# Patient Record
Sex: Female | Born: 1980 | Hispanic: Yes | Marital: Single | Smoking: Never smoker
Health system: Southern US, Community
[De-identification: ages and names within clinical notes are randomized; demographics above are authoritative.]

## PROBLEM LIST (undated history)

## (undated) DIAGNOSIS — E66812 Obesity, class 2: Secondary | ICD-10-CM

## (undated) DIAGNOSIS — I10 Essential (primary) hypertension: Secondary | ICD-10-CM

## (undated) DIAGNOSIS — E669 Obesity, unspecified: Secondary | ICD-10-CM

## (undated) DIAGNOSIS — E559 Vitamin D deficiency, unspecified: Secondary | ICD-10-CM

## (undated) DIAGNOSIS — L83 Acanthosis nigricans: Secondary | ICD-10-CM

## (undated) DIAGNOSIS — E8881 Metabolic syndrome: Secondary | ICD-10-CM

## (undated) DIAGNOSIS — T7840XA Allergy, unspecified, initial encounter: Secondary | ICD-10-CM

## (undated) HISTORY — DX: Essential (primary) hypertension: I10

## (undated) HISTORY — DX: Vitamin D deficiency, unspecified: E55.9

## (undated) HISTORY — DX: Allergy, unspecified, initial encounter: T78.40XA

## (undated) HISTORY — DX: Obesity, class 2: E66.812

## (undated) HISTORY — DX: Obesity, unspecified: E66.9

## (undated) HISTORY — PX: MOUTH SURGERY: SHX715

## (undated) HISTORY — DX: Metabolic syndrome: E88.810

## (undated) HISTORY — DX: Metabolic syndrome: E88.81

## (undated) HISTORY — DX: Acanthosis nigricans: L83

---

## 2013-08-26 LAB — HM PAP SMEAR: HM Pap smear: NORMAL

## 2013-11-23 LAB — LIPID PANEL
Cholesterol: 162 mg/dL (ref 0–200)
HDL: 46 mg/dL (ref 35–70)
LDL Cholesterol: 97 mg/dL
TRIGLYCERIDES: 93 mg/dL (ref 40–160)

## 2013-11-23 LAB — HEMOGLOBIN A1C: HEMOGLOBIN A1C: 5.4 % (ref 4.0–6.0)

## 2014-12-31 ENCOUNTER — Encounter: Payer: Self-pay | Admitting: Family Medicine

## 2014-12-31 DIAGNOSIS — L83 Acanthosis nigricans: Secondary | ICD-10-CM | POA: Insufficient documentation

## 2014-12-31 DIAGNOSIS — E8881 Metabolic syndrome: Secondary | ICD-10-CM | POA: Insufficient documentation

## 2014-12-31 DIAGNOSIS — E559 Vitamin D deficiency, unspecified: Secondary | ICD-10-CM | POA: Insufficient documentation

## 2014-12-31 DIAGNOSIS — J3089 Other allergic rhinitis: Secondary | ICD-10-CM | POA: Insufficient documentation

## 2014-12-31 DIAGNOSIS — E669 Obesity, unspecified: Secondary | ICD-10-CM | POA: Insufficient documentation

## 2014-12-31 DIAGNOSIS — I10 Essential (primary) hypertension: Secondary | ICD-10-CM | POA: Insufficient documentation

## 2015-01-04 ENCOUNTER — Ambulatory Visit (INDEPENDENT_AMBULATORY_CARE_PROVIDER_SITE_OTHER): Payer: BC Managed Care – PPO | Admitting: Family Medicine

## 2015-01-04 ENCOUNTER — Encounter (INDEPENDENT_AMBULATORY_CARE_PROVIDER_SITE_OTHER): Payer: Self-pay

## 2015-01-04 ENCOUNTER — Encounter: Payer: Self-pay | Admitting: Family Medicine

## 2015-01-04 VITALS — BP 132/84 | HR 98 | Temp 99.0°F | Resp 16 | Ht 61.0 in | Wt 201.3 lb

## 2015-01-04 DIAGNOSIS — J309 Allergic rhinitis, unspecified: Secondary | ICD-10-CM | POA: Diagnosis not present

## 2015-01-04 DIAGNOSIS — IMO0002 Reserved for concepts with insufficient information to code with codable children: Secondary | ICD-10-CM

## 2015-01-04 DIAGNOSIS — Z114 Encounter for screening for human immunodeficiency virus [HIV]: Secondary | ICD-10-CM | POA: Diagnosis not present

## 2015-01-04 DIAGNOSIS — E8881 Metabolic syndrome: Secondary | ICD-10-CM

## 2015-01-04 DIAGNOSIS — I10 Essential (primary) hypertension: Secondary | ICD-10-CM

## 2015-01-04 DIAGNOSIS — E668 Other obesity: Secondary | ICD-10-CM

## 2015-01-04 DIAGNOSIS — J3089 Other allergic rhinitis: Secondary | ICD-10-CM

## 2015-01-04 MED ORDER — LORATADINE 10 MG PO TABS: 10.0000 mg | ORAL_TABLET | ORAL | Status: DC

## 2015-01-04 MED ORDER — QSYMIA 15-92 MG PO CP24
1.0000 | ORAL_CAPSULE | Freq: Every day | ORAL | Status: DC
Start: 2015-01-04 — End: 2015-04-07

## 2015-01-04 MED ORDER — TRIAMTERENE-HCTZ 37.5-25 MG PO TABS: 1.0000 | ORAL_TABLET | Freq: Every day | ORAL | Status: DC

## 2015-01-04 NOTE — Progress Notes (Signed)
Name: Kaitlin Schroeder   MRN: [id number]    DOB: 1980-10-17   Date:01/04/2015       Progress Note  Subjective  Chief Complaint  Chief Complaint  Patient presents with  . Obesity  . Allergic Rhinitis   . Hypertension    HPI  Obesity: she has been taking Qsymia since 04/2013, initial weight was 218lbs, she exercises five days weekly.  Eats less than 300 calories for lunch and a light lunch - usually eats salads/soup and a home cooked meal for dinner. She has lost 5% of her original weight and denies side effects of Qsymia, such a dry mouth, constipation or paresthesias.  AR: noticing more nasal congestion, watery eyes, no sneezing or wheezing.  She is currently taking Loratadine and singulair, and will resume nasal steroid.  HTN: taking half dose of Triamterene-HCTZ, bp is at goal , may consider going up to one pill. No side effects, compliant with medication, denies chest pain no SOB.  Metabolic Syndrome: last hgbA1C was one year ago, no polydipsia, polyuria or polyphagia  Patient Active Problem List   Diagnosis Date Noted  . Acanthosis nigricans 12/31/2014  . Essential (primary) hypertension 12/31/2014  . Dysmetabolic syndrome 12/31/2014  . Adult BMI 30+ 12/31/2014  . Perennial allergic rhinitis 12/31/2014  . Vitamin D deficiency 12/31/2014    History  Substance Use Topics  . Smoking status: Never Smoker   . Smokeless tobacco: Not on file  . Alcohol Use: 0.0 oz/week    0 Standard drinks or equivalent per week     Comment: social     Current outpatient prescriptions:  .  Cholecalciferol (VITAMIN D) 2000 UNITS tablet, Take 1 tablet by mouth daily., Disp: , Rfl:  .  fluticasone (FLONASE) 50 MCG/ACT nasal spray, Place 2 sprays into the nose as needed., Disp: , Rfl:  .  loratadine (CLARITIN) 10 MG tablet, Take 1 tablet (10 mg total) by mouth 1 day or 1 dose., Disp: 90 tablet, Rfl: 1 .  montelukast (SINGULAIR) 10 MG tablet, Take 1 tablet by mouth daily., Disp: , Rfl:  .  Multiple  Vitamin tablet, Take 1 tablet by mouth daily., Disp: , Rfl:  .  QSYMIA 15-92 MG CP24, Take 1 tablet by mouth daily., Disp: 30 capsule, Rfl: 2 .  triamterene-hydrochlorothiazide (MAXZIDE-25) 37.5-25 MG per tablet, Take 1 tablet by mouth daily., Disp: 90 tablet, Rfl: 0  No Known Allergies  ROS  .Constitutional: Negative for fever or weight change.  Respiratory: Negative for cough and shortness of breath.   Cardiovascular: Negative for chest pain or palpitations.  Gastrointestinal: Negative for abdominal pain, no bowel changes.  Musculoskeletal: Negative for gait problem or joint swelling.  Skin: Negative for rash.  Neurological: Negative for dizziness or headache.  No other specific complaints in a complete review of systems (except as listed in HPI above). Objective  Filed Vitals:   01/04/15 0838  BP: 132/84  Pulse: 98  Temp: 99 F (37.2 C)  TempSrc: Oral  Resp: 16  Height: 5\' 1"  (1.549 m)  Weight: 201 lb 4.8 oz (91.309 kg)  SpO2: 98%    Body mass index is 38.05 kg/(m^2).    Physical Exam   Constitutional: Patient appears well-developed and well-nourished. No distress.  HENT: Head: Normocephalic and atraumatic. Ears: B TMs ok, no erythema or effusion; Nose: Nose normal. Mouth/Throat: Oropharynx is clear and moist. No oropharyngeal exudate.  Eyes: Conjunctivae and EOM are normal. Pupils are equal, round, and reactive to light. No scleral icterus.  Neck: Normal range of motion. Neck supple. No JVD present. No thyromegaly present.  Cardiovascular: Normal rate, regular rhythm and normal heart sounds.  No murmur heard. No BLE edema. Pulmonary/Chest: Effort normal and breath sounds normal. No respiratory distress. Abdominal: Soft. Bowel sounds are normal, no distension. There is no tenderness. no masses Musculoskeletal: Normal range of motion, no joint effusions. No gross deformities Neurological: he is alert and oriented to person, place, and time. No cranial nerve deficit.  Coordination, balance, strength, speech and gait are normal.  Skin: Skin is warm and dry. No rash noted. No erythema.  Psychiatric: Patient has a normal mood and affect. behavior is normal. Judgment and thought content normal.    Assessment & Plan   1. Essential (primary) hypertension Increase to one pill daily - triamterene-hydrochlorothiazide (MAXZIDE-25) 37.5-25 MG per tablet; Take 1 tablet by mouth daily.  Dispense: 90 tablet; Refill: 0 - Comprehensive Metabolic Panel (CMET) - Lipid Profile  2. Perennial allergic rhinitis Resume flonase - loratadine (CLARITIN) 10 MG tablet; Take 1 tablet (10 mg total) by mouth 1 day or 1 dose.  Dispense: 90 tablet; Refill: 1  3. Adult BMI 30+ Advised to eat a heavier lunch and eat a light dinner, continue physical activity - QSYMIA 15-92 MG CP24; Take 1 tablet by mouth daily.  Dispense: 30 capsule; Refill: 2  4. Dysmetabolic syndrome  doing well, recheck labs - HgB A1c  5. Encounter for screening for HIV  - HIV antibody (with reflex)

## 2015-01-05 LAB — HEPATIC FUNCTION PANEL
ALK PHOS: 55 U/L (ref 25–125)
ALT: 28 U/L (ref 7–35)
AST: 22 U/L (ref 13–35)
BILIRUBIN, TOTAL: 0.4 mg/dL

## 2015-01-05 LAB — LIPID PANEL
Cholesterol: 135 mg/dL (ref 0–200)
HDL: 38 mg/dL (ref 35–70)
LDL Cholesterol: 87 mg/dL
TRIGLYCERIDES: 49 mg/dL (ref 40–160)

## 2015-01-05 LAB — BASIC METABOLIC PANEL
BUN: 11 mg/dL (ref 4–21)
CREATININE: 1 mg/dL (ref 0.5–1.1)
GLUCOSE: 83 mg/dL
Glucose: 103 mg/dL
Potassium: 3.4 mmol/L (ref 3.4–5.3)
SODIUM: 142 mmol/L (ref 137–147)

## 2015-01-05 LAB — HEMOGLOBIN A1C: HEMOGLOBIN A1C: 5.2 % (ref 4.0–6.0)

## 2015-01-25 ENCOUNTER — Encounter: Payer: Self-pay | Admitting: Family Medicine

## 2015-01-31 ENCOUNTER — Encounter: Payer: Self-pay | Admitting: Family Medicine

## 2015-03-14 ENCOUNTER — Encounter: Payer: Self-pay | Admitting: Family Medicine

## 2015-04-07 ENCOUNTER — Encounter: Payer: Self-pay | Admitting: Family Medicine

## 2015-04-07 ENCOUNTER — Ambulatory Visit (INDEPENDENT_AMBULATORY_CARE_PROVIDER_SITE_OTHER): Payer: BC Managed Care – PPO | Admitting: Family Medicine

## 2015-04-07 VITALS — BP 108/62 | HR 95 | Temp 98.3°F | Resp 16 | Ht 61.0 in | Wt 201.6 lb

## 2015-04-07 DIAGNOSIS — I10 Essential (primary) hypertension: Secondary | ICD-10-CM

## 2015-04-07 DIAGNOSIS — Z23 Encounter for immunization: Secondary | ICD-10-CM

## 2015-04-07 DIAGNOSIS — J3089 Other allergic rhinitis: Secondary | ICD-10-CM

## 2015-04-07 DIAGNOSIS — J309 Allergic rhinitis, unspecified: Secondary | ICD-10-CM

## 2015-04-07 DIAGNOSIS — IMO0002 Reserved for concepts with insufficient information to code with codable children: Secondary | ICD-10-CM

## 2015-04-07 DIAGNOSIS — E668 Other obesity: Secondary | ICD-10-CM | POA: Diagnosis not present

## 2015-04-07 MED ORDER — OLOPATADINE HCL 0.1 % OP SOLN: 1.0000 [drp] | Freq: Two times a day (BID) | OPHTHALMIC | Status: DC

## 2015-04-07 MED ORDER — QSYMIA 15-92 MG PO CP24: 1.0000 | ORAL_CAPSULE | Freq: Every day | ORAL | Status: DC

## 2015-04-07 NOTE — Progress Notes (Signed)
Name: Kaitlin Schroeder   MRN: [id number]    DOB: 12/04/1980   Date:04/07/2015       Progress Note  Subjective  Chief Complaint  Chief Complaint  Patient presents with  . Medication Refill    3 month F/U  . Hypertension  . Allergic Rhinitis     Congestion, and watery eyes, patient is taking medication daily and just added Flonase  . Obesity    Working out 5x weekly with 30 minutes sessions, and doing well on medication    HPI  Obesity: she has been taking Qsymia since 04/2013, initial weight was 218lbs, she exercises five days weekly. Eats less than 300 calories for breakfast  and has a  light lunch - usually eats salads/soup and a home cooked meal for dinner. She has lost 5% of her original weight and denies side effects of Qsymia, such a dry mouth, constipation or paresthesias. Weight has not changed since last visit. Discussed other options, she does not want an injectable at this time. Also discussed Belviq and Contrave  AR: noticing more  watery eyes, no sneezing or wheezing, nasal congestion has improved with nasal steroids prn . She is currently taking Loratadine and singulair.   HTN: taking half dose of Triamterene-HCTZ, bp is at goal. No side effects, compliant with medication, denies chest pain no SOB.  Metabolic Syndrome: last hgbA1C was one year ago, no polydipsia, polyuria or polyphagia  Patient Active Problem List   Diagnosis Date Noted  . Acanthosis nigricans 12/31/2014  . Essential (primary) hypertension 12/31/2014  . Dysmetabolic syndrome 12/31/2014  . Adult BMI 30+ 12/31/2014  . Perennial allergic rhinitis 12/31/2014  . Vitamin D deficiency 12/31/2014    Past Surgical History  Procedure Laterality Date  . Mouth surgery      Family History  Problem Relation Age of Onset  . Hypertension Mother   . Hypertension Father     Social History   Social History  . Marital Status: Single    Spouse Name: N/A  . Number of Children: N/A  . Years of Education: N/A    Occupational History  . Not on file.   Social History Main Topics  . Smoking status: Never Smoker   . Smokeless tobacco: Never Used  . Alcohol Use: 0.0 oz/week    0 Standard drinks or equivalent per week     Comment: social  . Drug Use: No  . Sexual Activity: Not Currently   Other Topics Concern  . Not on file   Social History Narrative     Current outpatient prescriptions:  .  Cholecalciferol (VITAMIN D) 2000 UNITS tablet, Take 1 tablet by mouth daily., Disp: , Rfl:  .  fluticasone (FLONASE) 50 MCG/ACT nasal spray, Place 2 sprays into the nose as needed., Disp: , Rfl:  .  loratadine (CLARITIN) 10 MG tablet, Take 1 tablet (10 mg total) by mouth 1 day or 1 dose., Disp: 90 tablet, Rfl: 1 .  montelukast (SINGULAIR) 10 MG tablet, Take 1 tablet by mouth daily., Disp: , Rfl:  .  Multiple Vitamin tablet, Take 1 tablet by mouth daily., Disp: , Rfl:  .  QSYMIA 15-92 MG CP24, Take 1 tablet by mouth daily., Disp: 30 capsule, Rfl: 2 .  triamterene-hydrochlorothiazide (MAXZIDE-25) 37.5-25 MG per tablet, Take 1 tablet by mouth daily., Disp: 90 tablet, Rfl: 0  No Known Allergies   ROS  Constitutional: Negative for fever or weight change.  Respiratory: Negative for cough and shortness of breath.   Cardiovascular:  Negative for chest pain or palpitations.  Gastrointestinal: Negative for abdominal pain, no bowel changes.  Musculoskeletal: Negative for gait problem or joint swelling.  Skin: Negative for rash.  Neurological: Negative for dizziness or headache.  No other specific complaints in a complete review of systems (except as listed in HPI above).  Objective  Filed Vitals:   04/07/15 0818  BP: 108/62  Pulse: 95  Temp: 98.3 F (36.8 C)  TempSrc: Oral  Resp: 16  Height:  (1.549 m)  Weight: 201 lb 9.6 oz (91.445 kg)  SpO2: 98%    Body mass index is 38.11 kg/(m^2).  Physical Exam  Constitutional: Patient appears well-developed and well-nourished. Obese No distress.   HEENT: head atraumatic, normocephalic, pupils equal and reactive to light, neck supple, throat within normal limits Cardiovascular: Normal rate, regular rhythm and normal heart sounds.  No murmur heard. Trace  BLE edema. Pulmonary/Chest: Effort normal and breath sounds normal. No respiratory distress. Abdominal: Soft.  There is no tenderness. Psychiatric: Patient has a normal mood and affect. behavior is normal. Judgment and thought content normal.  PHQ2/9: Depression screen [facility] 2/9 04/07/2015 01/04/2015  Decreased Interest 0 0  Down, Depressed, Hopeless 0 0  PHQ - 2 Score 0 0     Fall Risk: Fall Risk  04/07/2015 01/04/2015  Falls in the past year? No No    Functional Status Survey: Is the patient deaf or have difficulty hearing?: No Does the patient have difficulty seeing, even when wearing glasses/contacts?: No Does the patient have difficulty concentrating, remembering, or making decisions?: No Does the patient have difficulty walking or climbing stairs?: No Does the patient have difficulty dressing or bathing?: No Does the patient have difficulty doing errands alone such as visiting a doctor's office or shopping?: No    Assessment & Plan    1. Essential (primary) hypertension  Doing great, continue current dose of Triamterene/HCTZ DASH diet and physical activity  2. Perennial allergic rhinitis  We will add medication for eye symptoms, since symptoms are present on her right eye, also advised follow up with Opthalmologist  to make sure it is not the tear duct that is plugged - olopatadine (PATANOL) 0.1 % ophthalmic solution; Place 1 drop into both eyes 2 (two) times daily.  Dispense: 5 mL; Refill: 2  3. Needs flu shot  - Flu Vaccine QUAD 36+ mos PF IM (Fluarix & Fluzone Quad PF) - refused she will get it at work   4. Adult BMI 30+  Continue medication , diet and exercise - QSYMIA 15-92 MG CP24; Take 1 tablet by mouth daily.  Dispense: 30 capsule; Refill: 2

## 2015-05-05 ENCOUNTER — Encounter: Payer: Self-pay | Admitting: Family Medicine

## 2015-05-26 ENCOUNTER — Other Ambulatory Visit: Payer: Self-pay | Admitting: Family Medicine

## 2015-05-26 NOTE — Telephone Encounter (Signed)
Patient requesting refill. 

## 2015-06-29 ENCOUNTER — Ambulatory Visit (INDEPENDENT_AMBULATORY_CARE_PROVIDER_SITE_OTHER): Payer: BC Managed Care – PPO | Admitting: Family Medicine

## 2015-06-29 ENCOUNTER — Encounter: Payer: Self-pay | Admitting: Family Medicine

## 2015-06-29 VITALS — BP 122/82 | HR 111 | Temp 98.9°F | Resp 16 | Ht 61.0 in | Wt 199.9 lb

## 2015-06-29 DIAGNOSIS — I10 Essential (primary) hypertension: Secondary | ICD-10-CM | POA: Diagnosis not present

## 2015-06-29 DIAGNOSIS — J309 Allergic rhinitis, unspecified: Secondary | ICD-10-CM

## 2015-06-29 DIAGNOSIS — E668 Other obesity: Secondary | ICD-10-CM | POA: Diagnosis not present

## 2015-06-29 DIAGNOSIS — IMO0002 Reserved for concepts with insufficient information to code with codable children: Secondary | ICD-10-CM

## 2015-06-29 DIAGNOSIS — E8881 Metabolic syndrome: Secondary | ICD-10-CM

## 2015-06-29 DIAGNOSIS — J3089 Other allergic rhinitis: Secondary | ICD-10-CM

## 2015-06-29 MED ORDER — TRIAMTERENE-HCTZ 37.5-25 MG PO TABS: 1.0000 | ORAL_TABLET | Freq: Every day | ORAL | Status: DC

## 2015-06-29 MED ORDER — MONTELUKAST SODIUM 10 MG PO TABS: 10.0000 mg | ORAL_TABLET | Freq: Every day | ORAL | Status: DC

## 2015-06-29 MED ORDER — QSYMIA 15-92 MG PO CP24: 1.0000 | ORAL_CAPSULE | Freq: Every day | ORAL | Status: DC

## 2015-06-29 MED ORDER — NALTREXONE-BUPROPION HCL ER 8-90 MG PO TB12: 2.0000 | ORAL_TABLET | Freq: Two times a day (BID) | ORAL | Status: DC

## 2015-06-29 NOTE — Progress Notes (Signed)
Name: Kaitlin Schroeder   MRN: [id number]    DOB: 1980/07/24   Date:06/29/2015       Progress Note  Subjective  Chief Complaint  Chief Complaint  Patient presents with  . Medication Refill    3 month F/U  . Hypertension  . Obesity    Lost 3 pounds since last visit, exercise 5x a week-30 minutes daily, 3 meals a day  . Allergic Rhinitis     Well controlled    HPI  HTN:  Patient is taking half pill of Triamterene/HCTZ and is doing well, no chest pain , no SOB, no palpation. BP not being checked at home, but is at goal today  Obesity: she has been taking Qsymia since 04/2013, initial weight was 218lbs, she exercises five days weekly. Eats less than 300 calories for breakfast and has a light lunch - usually eats salads/soup and a home cooked meal for dinner. She has lost 5% of her original weight and denies side effects of Qsymia, such a dry mouth, constipation or paresthesias. Weight has decreased since last visit, down by 3 lbs.  Discussed other options, she does not want an injectable at this time. Also discussed Belviq and Contrave. She states insurance will change in coverage in January and we will need to switch from Qsymia to Contrave  AR: she is taking medication and has been doing well, symptoms are controlled, no rhinorrhea, nasal congestion or sneezing at this time.   Metabolic Syndrome: on life style modification, denies polyphagia, polyuria or polydipsia.    Patient Active Problem List   Diagnosis Date Noted  . Acanthosis nigricans 12/31/2014  . Essential (primary) hypertension 12/31/2014  . Dysmetabolic syndrome 12/31/2014  . Adult BMI 30+ 12/31/2014  . Perennial allergic rhinitis 12/31/2014  . Vitamin D deficiency 12/31/2014    Past Surgical History  Procedure Laterality Date  . Mouth surgery      Family History  Problem Relation Age of Onset  . Hypertension Mother   . Hypertension Father     Social History   Social History  . Marital Status: Single     Spouse Name: N/A  . Number of Children: N/A  . Years of Education: N/A   Occupational History  . Not on file.   Social History Main Topics  . Smoking status: Never Smoker   . Smokeless tobacco: Never Used  . Alcohol Use: 0.0 oz/week    0 Standard drinks or equivalent per week     Comment: social  . Drug Use: No  . Sexual Activity: Not Currently   Other Topics Concern  . Not on file   Social History Narrative     Current outpatient prescriptions:  .  Cholecalciferol (VITAMIN D) 2000 UNITS tablet, Take 1 tablet by mouth daily., Disp: , Rfl:  .  fluticasone (FLONASE) 50 MCG/ACT nasal spray, Place 2 sprays into the nose as needed., Disp: , Rfl:  .  loratadine (CLARITIN) 10 MG tablet, Take 1 tablet (10 mg total) by mouth 1 day or 1 dose., Disp: 90 tablet, Rfl: 1 .  montelukast (SINGULAIR) 10 MG tablet, Take 1 tablet (10 mg total) by mouth daily., Disp: 90 tablet, Rfl: 0 .  Multiple Vitamin tablet, Take 1 tablet by mouth daily., Disp: , Rfl:  .  olopatadine (PATANOL) 0.1 % ophthalmic solution, Place 1 drop into both eyes 2 (two) times daily., Disp: 5 mL, Rfl: 2 .  QSYMIA 15-92 MG CP24, Take 1 tablet by mouth daily., Disp: 30 capsule, Rfl:  2 .  triamterene-hydrochlorothiazide (MAXZIDE-25) 37.5-25 MG tablet, Take 1 tablet by mouth daily., Disp: 90 tablet, Rfl: 0  No Known Allergies   ROS  Constitutional: Negative for fever or significant  weight change.  Respiratory: Negative for cough and shortness of breath.   Cardiovascular: Negative for chest pain or palpitations.  Gastrointestinal: Negative for abdominal pain, no bowel changes.  Musculoskeletal: Negative for gait problem or joint swelling.  Skin: Negative for rash.  Neurological: Negative for dizziness or headache.  No other specific complaints in a complete review of systems (except as listed in HPI above).  Objective  Filed Vitals:   06/29/15 0820  BP: 122/82  Pulse: 111  Temp: 98.9 F (37.2 C)  TempSrc: Oral   Resp: 16  Height: 5\' 1"  (1.549 m)  Weight: 199 lb 14.4 oz (90.674 kg)  SpO2: 98%    Body mass index is 37.79 kg/(m^2).  Physical Exam  Constitutional: Patient appears well-developed and well-nourished. Obese  No distress.  HEENT: head atraumatic, normocephalic, pupils equal and reactive to light, ears  TM, neck supple, throat within normal limits Cardiovascular: Normal rate, regular rhythm and normal heart sounds.  No murmur heard. No BLE edema. Pulmonary/Chest: Effort normal and breath sounds normal. No respiratory distress. Abdominal: Soft.  There is no tenderness. Psychiatric: Patient has a normal mood and affect. behavior is normal. Judgment and thought content normal.   [facility]9: [facility] 06/29/2015 04/07/2015 01/04/2015  Decreased Interest 0 0 0  Down, Depressed, Hopeless 0 0 0  PHQ - 2 Score 0 0 0    Fall Risk: Fall Risk  06/29/2015 04/07/2015 01/04/2015  Falls in the past year? No No No    Functional Status Survey: Is the patient deaf or have difficulty hearing?: No Does the patient have difficulty seeing, even when wearing glasses/contacts?: No Does the patient have difficulty concentrating, remembering, or making decisions?: No Does the patient have difficulty walking or climbing stairs?: No Does the patient have difficulty dressing or bathing?: No Does the patient have difficulty doing errands alone such as visiting a doctor's office or shopping?: No    Assessment & Plan  1. Essential (primary) hypertension  Doing well, no side effects, continue medication  - triamterene-hydrochlorothiazide (MAXZIDE-25) 37.5-25 MG tablet; Take 1 tablet by mouth daily.  Dispense: 90 tablet; Refill: 0  2. Adult BMI 30+  - QSYMIA 15-92 MG CP24; Take 1 tablet by mouth daily.  Dispense: 30 capsule She will submit Contrave on January first for PA - since insurance will stop covering Qsymia, and we may need to do a PA  3. Dysmetabolic syndrome  Continue life  style modification   4. Perennial allergic rhinitis  Well controlled at this time - montelukast (SINGULAIR) 10 MG tablet; Take 1 tablet (10 mg total) by mouth daily.  Dispense: 90 tablet; Refill: 0

## 2015-06-29 NOTE — Addendum Note (Signed)
Addended by: Alba CorySOWLES, Juluis Fitzsimmons F on: 06/29/2015 08:59 AM   Modules accepted: Orders, Medications, SmartSet

## 2015-08-12 ENCOUNTER — Encounter: Payer: Self-pay | Admitting: Family Medicine

## 2015-09-14 ENCOUNTER — Ambulatory Visit (INDEPENDENT_AMBULATORY_CARE_PROVIDER_SITE_OTHER): Payer: BC Managed Care – PPO | Admitting: Family Medicine

## 2015-09-14 ENCOUNTER — Encounter: Payer: Self-pay | Admitting: Family Medicine

## 2015-09-14 VITALS — BP 134/86 | HR 89 | Temp 98.8°F | Resp 16 | Ht 61.0 in | Wt 203.7 lb

## 2015-09-14 DIAGNOSIS — Z124 Encounter for screening for malignant neoplasm of cervix: Secondary | ICD-10-CM

## 2015-09-14 DIAGNOSIS — IMO0002 Reserved for concepts with insufficient information to code with codable children: Secondary | ICD-10-CM

## 2015-09-14 DIAGNOSIS — J309 Allergic rhinitis, unspecified: Secondary | ICD-10-CM | POA: Diagnosis not present

## 2015-09-14 DIAGNOSIS — E668 Other obesity: Secondary | ICD-10-CM

## 2015-09-14 DIAGNOSIS — I10 Essential (primary) hypertension: Secondary | ICD-10-CM | POA: Diagnosis not present

## 2015-09-14 DIAGNOSIS — Z0000 Encounter for general adult medical examination without abnormal findings: Secondary | ICD-10-CM

## 2015-09-14 DIAGNOSIS — J3089 Other allergic rhinitis: Secondary | ICD-10-CM

## 2015-09-14 DIAGNOSIS — Z01419 Encounter for gynecological examination (general) (routine) without abnormal findings: Secondary | ICD-10-CM

## 2015-09-14 MED ORDER — MONTELUKAST SODIUM 10 MG PO TABS: 10.0000 mg | ORAL_TABLET | Freq: Every day | ORAL | Status: DC

## 2015-09-14 MED ORDER — TRIAMTERENE-HCTZ 37.5-25 MG PO TABS: 1.0000 | ORAL_TABLET | Freq: Every day | ORAL | Status: DC

## 2015-09-14 MED ORDER — VIT C-QUERCET-BIOFLV-BROMELAIN 450-250-125-50 MG PO CAPS: 1.0000 | ORAL_CAPSULE | Freq: Every day | ORAL | Status: DC

## 2015-09-14 NOTE — Progress Notes (Signed)
Name: Kaitlin Schroeder   MRN: [id number]    DOB: 04/25/1981   Date:09/14/2015       Progress Note  Subjective  Chief Complaint  Chief Complaint  Patient presents with  . Annual Exam    HPI  Well Woman: patient states allergies is flaring a little bit. No vaginal symptoms or urinary complaints. Cycles are regular and moderate flow except for the first day that is heavy and about 28 days apart. No cramping, not on ocp, practices abstinence.   HTN: patient is taking medication and denies side effects, no chest pain, no palpitation. BP at home is usually around 130's/80's.   Perennial Allergic Rhinitis: taking Loratadine prn, currently on supplement called Quercetin with Bromelain and it seems to be helping with symptoms. She has some nasal congestion and runny eye, no itching.   Adult Obesity: insurance stopped paying for Qsymia, so she just got switched to [facility], she had a gap in medication, gained 3.7 lbs. Still not taking full dose of Contrave. Gave reassurance, continue diet , medication and resume exercise  Patient Active Problem List   Diagnosis Date Noted  . Acanthosis nigricans 12/31/2014  . Essential (primary) hypertension 12/31/2014  . Dysmetabolic syndrome 12/31/2014  . Adult BMI 30+ 12/31/2014  . Perennial allergic rhinitis 12/31/2014  . Vitamin D deficiency 12/31/2014    Past Surgical History  Procedure Laterality Date  . Mouth surgery      Family History  Problem Relation Age of Onset  . Hypertension Mother   . Hypertension Father     Social History   Social History  . Marital Status: Single    Spouse Name: N/A  . Number of Children: N/A  . Years of Education: N/A   Occupational History  . Not on file.   Social History Main Topics  . Smoking status: Never Smoker   . Smokeless tobacco: Never Used  . Alcohol Use: 0.0 oz/week    0 Standard drinks or equivalent per week     Comment: social  . Drug Use: No  . Sexual Activity: Not Currently   Other  Topics Concern  . Not on file   Social History Narrative     Current outpatient prescriptions:  .  Cholecalciferol (VITAMIN D) 2000 UNITS tablet, Take 1 tablet by mouth daily., Disp: , Rfl:  .  fluticasone (FLONASE) 50 MCG/ACT nasal spray, Place 2 sprays into the nose as needed., Disp: , Rfl:  .  loratadine (CLARITIN) 10 MG tablet, Take 1 tablet (10 mg total) by mouth 1 day or 1 dose., Disp: 90 tablet, Rfl: 1 .  montelukast (SINGULAIR) 10 MG tablet, Take 1 tablet (10 mg total) by mouth daily., Disp: 90 tablet, Rfl: 0 .  Multiple Vitamin tablet, Take 1 tablet by mouth daily., Disp: , Rfl:  .  Naltrexone-Bupropion HCl ER (CONTRAVE) 8-90 MG TB12, Take 2 tablets by mouth 2 (two) times daily., Disp: 120 tablet, Rfl: 2 .  olopatadine (PATANOL) 0.1 % ophthalmic solution, Place 1 drop into both eyes 2 (two) times daily., Disp: 5 mL, Rfl: 2 .  triamterene-hydrochlorothiazide (MAXZIDE-25) 37.5-25 MG tablet, Take 1 tablet by mouth daily., Disp: 90 tablet, Rfl: 0  No Known Allergies   ROS  Constitutional: Negative for fever or significant  weight change.  Respiratory: Negative for cough and shortness of breath.   Cardiovascular: Negative for chest pain or palpitations.  Gastrointestinal: Negative for abdominal pain, no bowel changes.  Musculoskeletal: Negative for gait problem or joint swelling.  Skin: Negative  for rash.  Neurological: Negative for dizziness or headache.  No other specific complaints in a complete review of systems (except as listed in HPI above).  Objective  Filed Vitals:   09/14/15 0816  BP: 134/86  Pulse: 89  Temp: 98.8 F (37.1 C)  TempSrc: Oral  Resp: 16  Height:  (1.549 m)  Weight: 203 lb 11.2 oz (92.398 kg)  SpO2: 96%    Body mass index is 38.51 kg/(m^2).  Physical Exam  Constitutional: Patient appears well-developed and well-nourished. No distress.  HENT: Head: Normocephalic and atraumatic. Ears: B TMs ok, no erythema or effusion; Nose: Nose  normal. Mouth/Throat: Oropharynx is clear and moist. No oropharyngeal exudate.  Eyes: Conjunctivae and EOM are normal. Pupils are equal, round, and reactive to light. No scleral icterus.  Neck: Normal range of motion. Neck supple. No JVD present. No thyromegaly present.  Cardiovascular: Normal rate, regular rhythm and normal heart sounds.  No murmur heard. No BLE edema. Pulmonary/Chest: Effort normal and breath sounds normal. No respiratory distress. Abdominal: Soft. Bowel sounds are normal, no distension. There is no tenderness. no masses Breast: no lumps or masses, no nipple discharge or rashes FEMALE GENITALIA:  External genitalia normal External urethra normal Pelvic not done RECTAL: not done Musculoskeletal: Normal range of motion, no joint effusions. No gross deformities Neurological: he is alert and oriented to person, place, and time. No cranial nerve deficit. Coordination, balance, strength, speech and gait are normal.  Skin: Skin is warm and dry. No rash noted. No erythema.  Psychiatric: Patient has a normal mood and affect. behavior is normal. Judgment and thought content normal.  PHQ2/9: Depression screen [facility] 2/9 09/14/2015 06/29/2015 04/07/2015 01/04/2015  Decreased Interest 0 0 0 0  Down, Depressed, Hopeless 0 0 0 0  PHQ - 2 Score 0 0 0 0    Fall Risk: Fall Risk  09/14/2015 06/29/2015 04/07/2015 01/04/2015  Falls in the past year? No No No No    Functional Status Survey: Is the patient deaf or have difficulty hearing?: No Does the patient have difficulty seeing, even when wearing glasses/contacts?: No Does the patient have difficulty concentrating, remembering, or making decisions?: No Does the patient have difficulty walking or climbing stairs?: No Does the patient have difficulty dressing or bathing?: No Does the patient have difficulty doing errands alone such as visiting a doctor's office or shopping?: No    Assessment & Plan  1. Well woman exam  Discussed  importance of 150 minutes of physical activity weekly, eat two servings of fish weekly, eat one serving of tree nuts ( cashews, pistachios, pecans, almonds.Marland Kitchen) every other day, eat 6 servings of fruit/vegetables daily and drink plenty of water and avoid sweet beverages.   2. Cervical cancer screening   not due until next year  3. Essential (primary) hypertension  - triamterene-hydrochlorothiazide (MAXZIDE-25) 37.5-25 MG tablet; Take 1 tablet by mouth daily.  Dispense: 90 tablet; Refill: 0  4. Perennial allergic rhinitis  - montelukast (SINGULAIR) 10 MG tablet; Take 1 tablet (10 mg total) by mouth daily.  Dispense: 90 tablet; Refill: 0 - Vit C-Quercet-Bioflv-Bromelain 450-250-125-50 MG CAPS; Take 1 capsule by mouth daily.  Dispense: 30 capsule; Refill: 0  5. Adult BMI 30+  Discussed with the patient the risk posed by an increased BMI. Discussed importance of portion control, calorie counting and at least 150 minutes of physical activity weekly. Avoid sweet beverages and drink more water. Eat at least 6 servings of fruit and vegetables daily

## 2015-09-27 ENCOUNTER — Ambulatory Visit: Payer: BC Managed Care – PPO | Admitting: Family Medicine

## 2015-12-21 ENCOUNTER — Encounter: Payer: Self-pay | Admitting: Family Medicine

## 2015-12-21 ENCOUNTER — Ambulatory Visit (INDEPENDENT_AMBULATORY_CARE_PROVIDER_SITE_OTHER): Payer: BC Managed Care – PPO | Admitting: Family Medicine

## 2015-12-21 VITALS — BP 124/86 | HR 96 | Temp 98.6°F | Resp 16 | Wt 205.6 lb

## 2015-12-21 DIAGNOSIS — J309 Allergic rhinitis, unspecified: Secondary | ICD-10-CM

## 2015-12-21 DIAGNOSIS — I10 Essential (primary) hypertension: Secondary | ICD-10-CM

## 2015-12-21 DIAGNOSIS — E8881 Metabolic syndrome: Secondary | ICD-10-CM

## 2015-12-21 DIAGNOSIS — E668 Other obesity: Secondary | ICD-10-CM

## 2015-12-21 DIAGNOSIS — Z13220 Encounter for screening for lipoid disorders: Secondary | ICD-10-CM

## 2015-12-21 DIAGNOSIS — IMO0002 Reserved for concepts with insufficient information to code with codable children: Secondary | ICD-10-CM

## 2015-12-21 DIAGNOSIS — E559 Vitamin D deficiency, unspecified: Secondary | ICD-10-CM

## 2015-12-21 DIAGNOSIS — J3089 Other allergic rhinitis: Secondary | ICD-10-CM

## 2015-12-21 MED ORDER — TRIAMTERENE-HCTZ 37.5-25 MG PO TABS: 1.0000 | ORAL_TABLET | Freq: Every day | ORAL | Status: DC

## 2015-12-21 MED ORDER — NALTREXONE-BUPROPION HCL ER 8-90 MG PO TB12: 2.0000 | ORAL_TABLET | Freq: Two times a day (BID) | ORAL | Status: DC

## 2015-12-21 MED ORDER — MONTELUKAST SODIUM 10 MG PO TABS: 10.0000 mg | ORAL_TABLET | Freq: Every day | ORAL | Status: DC

## 2015-12-21 NOTE — Progress Notes (Signed)
Name: Kaitlin Schroeder   MRN: [id number]    DOB: 04-Dec-1980   Date:12/21/2015       Progress Note  Subjective  Chief Complaint  Chief Complaint  Patient presents with  . Follow-up    patient is here for her 68-month f/u    HPI   HTN: patient is taking medication and denies side effects, no chest pain,  Palpitation or SOB. She has not been checking bp frequently but last time it was 125/84.    Perennial Allergic Rhinitis: taking Loratadine and singulair and nasal spray prn. She has some nasal congestion and runny eye almost every morning, no itching.   Adult Obesity: insurance stopped paying for Qsymia, her original weight was 218 lbs on 04/2013.  She was down to 199 lbs in December, but medication had to be switched to [facility] around Feb 2017 after a gap on medication and she is up to 205.6%. She has been taking medication daily, walking on average 6000 steps per day, no longer walking at lunch, logging meals occasionally. Goal is to go down to below 200 again. She will log her intake on weekends and try to walk 78295 steps before next visit. No side effects of medication, it seems to curb her appetite  Metabolic Syndrome: hgbA1C was 5.2 one year ago. She denies polyphagia, polydipsia or polyuria.  Patient Active Problem List   Diagnosis Date Noted  . Acanthosis nigricans 12/31/2014  . Essential (primary) hypertension 12/31/2014  . Dysmetabolic syndrome 12/31/2014  . Adult BMI 30+ 12/31/2014  . Perennial allergic rhinitis 12/31/2014  . Vitamin D deficiency 12/31/2014    Past Surgical History  Procedure Laterality Date  . Mouth surgery      Family History  Problem Relation Age of Onset  . Hypertension Mother   . Hypertension Father     Social History   Social History  . Marital Status: Single    Spouse Name: N/A  . Number of Children: N/A  . Years of Education: N/A   Occupational History  . Not on file.   Social History Main Topics  . Smoking status: Never Smoker   .  Smokeless tobacco: Never Used  . Alcohol Use: 0.0 oz/week    0 Standard drinks or equivalent per week     Comment: social  . Drug Use: No  . Sexual Activity: Not Currently   Other Topics Concern  . Not on file   Social History Narrative     Current outpatient prescriptions:  .  Cholecalciferol (VITAMIN D) 2000 UNITS tablet, Take 1 tablet by mouth daily., Disp: , Rfl:  .  fluticasone (FLONASE) 50 MCG/ACT nasal spray, Place 2 sprays into the nose as needed., Disp: , Rfl:  .  loratadine (CLARITIN) 10 MG tablet, Take 1 tablet (10 mg total) by mouth 1 day or 1 dose., Disp: 90 tablet, Rfl: 1 .  montelukast (SINGULAIR) 10 MG tablet, Take 1 tablet (10 mg total) by mouth daily., Disp: 90 tablet, Rfl: 1 .  Multiple Vitamin tablet, Take 1 tablet by mouth daily., Disp: , Rfl:  .  Naltrexone-Bupropion HCl ER (CONTRAVE) 8-90 MG TB12, Take 2 tablets by mouth 2 (two) times daily., Disp: 120 tablet, Rfl: 2 .  olopatadine (PATANOL) 0.1 % ophthalmic solution, Place 1 drop into both eyes 2 (two) times daily., Disp: 5 mL, Rfl: 2 .  triamterene-hydrochlorothiazide (MAXZIDE-25) 37.5-25 MG tablet, Take 1 tablet by mouth daily., Disp: 90 tablet, Rfl: 1 .  Vit C-Quercet-Bioflv-Bromelain 450-250-125-50 MG CAPS, Take 1  capsule by mouth daily., Disp: 30 capsule, Rfl: 0  No Known Allergies   ROS  Constitutional: Negative for fever , positive for mild weight change.  Respiratory: Negative for cough and shortness of breath.   Cardiovascular: Negative for chest pain or palpitations.  Gastrointestinal: Negative for abdominal pain, no bowel changes.  Musculoskeletal: Negative for gait problem or joint swelling.  Skin: Negative for rash.  Neurological: Negative for dizziness or headache.  No other specific complaints in a complete review of systems (except as listed in HPI above).  Objective  Filed Vitals:   12/21/15 0816  BP: 124/86  Pulse: 96  Temp: 98.6 F (37 C)  TempSrc: Oral  Resp: 16  Weight: 205  lb 9.6 oz (93.26 kg)  SpO2: 99%    Body mass index is 38.87 kg/(m^2).  Physical Exam  Constitutional: Patient appears well-developed and well-nourished. Obese  No distress.  HEENT: head atraumatic, normocephalic, pupils equal and reactive to light, neck supple, throat within normal limits Cardiovascular: Normal rate, regular rhythm and normal heart sounds.  No murmur heard. No BLE edema. Pulmonary/Chest: Effort normal and breath sounds normal. No respiratory distress. Abdominal: Soft.  There is no tenderness. Psychiatric: Patient has a normal mood and affect. behavior is normal. Judgment and thought content normal.  No results found for this or any previous visit (from the past 2160 hour(s)).   PHQ2/9: Depression screen [facility] 12/21/2015 09/14/2015 06/29/2015 04/07/2015 01/04/2015  Decreased Interest 0 0 0 0 0  Down, Depressed, Hopeless 0 0 0 0 0  PHQ - 2 Score 0 0 0 0 0    Fall Risk: Fall Risk  12/21/2015 09/14/2015 06/29/2015 04/07/2015 01/04/2015  Falls in the past year? No No No No No    Functional Status Survey: Is the patient deaf or have difficulty hearing?: No Does the patient have difficulty seeing, even when wearing glasses/contacts?: No Does the patient have difficulty concentrating, remembering, or making decisions?: No Does the patient have difficulty walking or climbing stairs?: No Does the patient have difficulty dressing or bathing?: No Does the patient have difficulty doing errands alone such as visiting a doctor's office or shopping?: No    Assessment & Plan  1. Dysmetabolic syndrome  Continue life style modification - Hemoglobin A1c  2. Essential (primary) hypertension  - triamterene-hydrochlorothiazide (MAXZIDE-25) 37.5-25 MG tablet; Take 1 tablet by mouth daily.  Dispense: 90 tablet; Refill: 1 - Comprehensive metabolic panel  3. Adult BMI 30+  - Naltrexone-Bupropion HCl ER (CONTRAVE) 8-90 MG TB12; Take 2 tablets by mouth 2 (two) times daily.  Dispense:  120 tablet; Refill: 2  4. Perennial allergic rhinitis  - montelukast (SINGULAIR) 10 MG tablet; Take 1 tablet (10 mg total) by mouth daily.  Dispense: 90 tablet; Refill: 1  5. Vitamin D deficiency  - VITAMIN D 25 Hydroxy (Vit-D Deficiency, Fractures)  6. Lipid screening  - Lipid panel

## 2015-12-22 LAB — VITAMIN D 25 HYDROXY (VIT D DEFICIENCY, FRACTURES): VIT D 25 HYDROXY: 36.7 ng/mL (ref 30.0–100.0)

## 2015-12-22 LAB — COMPREHENSIVE METABOLIC PANEL
ALBUMIN: 4.7 g/dL (ref 3.5–5.5)
ALK PHOS: 50 IU/L (ref 39–117)
ALT: 21 IU/L (ref 0–32)
AST: 15 IU/L (ref 0–40)
Albumin/Globulin Ratio: 1.7 (ref 1.2–2.2)
BILIRUBIN TOTAL: 0.3 mg/dL (ref 0.0–1.2)
BUN / CREAT RATIO: 14 (ref 9–23)
BUN: 14 mg/dL (ref 6–20)
CHLORIDE: 100 mmol/L (ref 96–106)
CO2: 23 mmol/L (ref 18–29)
CREATININE: 0.97 mg/dL (ref 0.57–1.00)
Calcium: 9.9 mg/dL (ref 8.7–10.2)
GFR calc non Af Amer: 75 mL/min/{1.73_m2} (ref >59)
GFR, EST AFRICAN AMERICAN: 87 mL/min/{1.73_m2} (ref >59)
GLOBULIN, TOTAL: 2.7 g/dL (ref 1.5–4.5)
Glucose: 73 mg/dL (ref 65–99)
Potassium: 3.5 mmol/L (ref 3.5–5.2)
SODIUM: 142 mmol/L (ref 134–144)
TOTAL PROTEIN: 7.4 g/dL (ref 6.0–8.5)

## 2015-12-22 LAB — HEMOGLOBIN A1C
Est. average glucose Bld gHb Est-mCnc: 100 mg/dL
Hgb A1c MFr Bld: 5.1 % (ref 4.8–5.6)

## 2015-12-22 LAB — LIPID PANEL
CHOL/HDL RATIO: 3.8 ratio (ref 0.0–4.4)
CHOLESTEROL TOTAL: 167 mg/dL (ref 100–199)
HDL: 44 mg/dL (ref >39)
LDL CALC: 103 mg/dL — AB (ref 0–99)
TRIGLYCERIDES: 100 mg/dL (ref 0–149)
VLDL Cholesterol Cal: 20 mg/dL (ref 5–40)

## 2016-03-21 ENCOUNTER — Ambulatory Visit (INDEPENDENT_AMBULATORY_CARE_PROVIDER_SITE_OTHER): Payer: BC Managed Care – PPO | Admitting: Family Medicine

## 2016-03-21 ENCOUNTER — Encounter: Payer: Self-pay | Admitting: Family Medicine

## 2016-03-21 VITALS — BP 122/88 | HR 86 | Temp 99.2°F | Resp 18 | Wt 207.6 lb

## 2016-03-21 DIAGNOSIS — E8881 Metabolic syndrome: Secondary | ICD-10-CM | POA: Diagnosis not present

## 2016-03-21 DIAGNOSIS — E668 Other obesity: Secondary | ICD-10-CM | POA: Diagnosis not present

## 2016-03-21 DIAGNOSIS — J3089 Other allergic rhinitis: Secondary | ICD-10-CM

## 2016-03-21 DIAGNOSIS — E559 Vitamin D deficiency, unspecified: Secondary | ICD-10-CM

## 2016-03-21 DIAGNOSIS — I10 Essential (primary) hypertension: Secondary | ICD-10-CM

## 2016-03-21 DIAGNOSIS — IMO0002 Reserved for concepts with insufficient information to code with codable children: Secondary | ICD-10-CM

## 2016-03-21 DIAGNOSIS — J309 Allergic rhinitis, unspecified: Secondary | ICD-10-CM | POA: Diagnosis not present

## 2016-03-21 MED ORDER — NALTREXONE-BUPROPION HCL ER 8-90 MG PO TB12: 2.0000 | ORAL_TABLET | Freq: Two times a day (BID) | ORAL | 2 refills | Status: DC

## 2016-03-21 MED ORDER — CETIRIZINE HCL 10 MG PO TABS: 10.0000 mg | ORAL_TABLET | Freq: Every day | ORAL | 2 refills | Status: DC

## 2016-03-21 NOTE — Patient Instructions (Signed)
Check with insurance about Saxenda coverage

## 2016-03-21 NOTE — Progress Notes (Signed)
Name: Kaitlin Schroeder   MRN: [id number]    DOB: 01-17-1981   Date:03/21/2016       Progress Note  Subjective  Chief Complaint  Chief Complaint  Patient presents with  . Obesity  . Hypertension  . Allergic Rhinitis     HPI  HTN: patient is taking medication and denies side effects, no chest pain, palpitation or SOB. She has not been checking bp frequently but last time it was 120's/80's. Taking half pill of Maxzide  Perennial Allergic Rhinitis: taking Cetirizine  and singulair daily and nasal spray prn. She has some nasal congestion and runny eye almost every morning, no itching. Only using eye drops prn.   Adult Obesity: insurance stopped paying for Qsymia, her original weight was 218 lbs on 04/2013.  She was down to 199 lbs in December 2016 but medication had to be switched to [facility] around Feb 2017 after a gap on medication and she is up to 205.6lbs She has been taking medication daily but forgets to take afternoon dose most of the time, walking on average 8000 steps per day, no longer walking at lunch, logging meals occasionally. Goal is to go down to below 200 again. She will log her intake on weekends and try to walk 40981 steps before next visit. No side effects of medication, it seems to curb her appetite. She is not eating anything after dinner. Usually eats dinner at 6:30 pm. She seems to be snacking more during the day, when stressed at work.   Metabolic Syndrome: hgbA1C was 5.2 one year ago. She denies polyphagia, polydipsia or polyuria.   Patient Active Problem List   Diagnosis Date Noted  . Acanthosis nigricans 12/31/2014  . Essential (primary) hypertension 12/31/2014  . Dysmetabolic syndrome 12/31/2014  . Adult BMI 30+ 12/31/2014  . Perennial allergic rhinitis 12/31/2014  . Vitamin D deficiency 12/31/2014    Past Surgical History:  Procedure Laterality Date  . MOUTH SURGERY      Family History  Problem Relation Age of Onset  . Hypertension Mother   .  Hypertension Father     Social History   Social History  . Marital status: Single    Spouse name: N/A  . Number of children: N/A  . Years of education: N/A   Occupational History  . Not on file.   Social History Main Topics  . Smoking status: Never Smoker  . Smokeless tobacco: Never Used  . Alcohol use 0.0 oz/week     Comment: social  . Drug use: No  . Sexual activity: Not Currently   Other Topics Concern  . Not on file   Social History Narrative  . No narrative on file     Current Outpatient Prescriptions:  .  Cholecalciferol (VITAMIN D) 2000 UNITS tablet, Take 1 tablet by mouth daily., Disp: , Rfl:  .  fluticasone (FLONASE) 50 MCG/ACT nasal spray, Place 2 sprays into the nose as needed., Disp: , Rfl:  .  montelukast (SINGULAIR) 10 MG tablet, Take 1 tablet (10 mg total) by mouth daily., Disp: 90 tablet, Rfl: 1 .  Multiple Vitamin tablet, Take 1 tablet by mouth daily., Disp: , Rfl:  .  Naltrexone-Bupropion HCl ER (CONTRAVE) 8-90 MG TB12, Take 2 tablets by mouth 2 (two) times daily., Disp: 120 tablet, Rfl: 2 .  olopatadine (PATANOL) 0.1 % ophthalmic solution, Place 1 drop into both eyes 2 (two) times daily., Disp: 5 mL, Rfl: 2 .  triamterene-hydrochlorothiazide (MAXZIDE-25) 37.5-25 MG tablet, Take 1 tablet by mouth  daily., Disp: 90 tablet, Rfl: 1 .  Vit C-Quercet-Bioflv-Bromelain 450-250-125-50 MG CAPS, Take 1 capsule by mouth daily., Disp: 30 capsule, Rfl: 0  No Known Allergies   ROS  Constitutional: Negative for fever or significant  weight change.  Respiratory: Negative for cough and shortness of breath.   Cardiovascular: Negative for chest pain or palpitations.  Gastrointestinal: Negative for abdominal pain, no bowel changes.  Musculoskeletal: Negative for gait problem or joint swelling.  Skin: Negative for rash.  Neurological: Negative for dizziness or headache.  No other specific complaints in a complete review of systems (except as listed in HPI  above).  Objective  Vitals:   03/21/16 0733  BP: 122/88  Pulse: 86  Resp: 18  Temp: 99.2 F (37.3 C)  TempSrc: Oral  SpO2: 95%  Weight: 207 lb 9.6 oz (94.2 kg)    Body mass index is 39.23 kg/m.  Physical Exam  Constitutional: Patient appears well-developed and well-nourished. Obese  No distress.  HEENT: head atraumatic, normocephalic, pupils equal and reactive to light,  neck supple, throat within normal limits Cardiovascular: Normal rate, regular rhythm and normal heart sounds.  No murmur heard. No BLE edema. Pulmonary/Chest: Effort normal and breath sounds normal. No respiratory distress. Abdominal: Soft.  There is no tenderness. Psychiatric: Patient has a normal mood and affect. behavior is normal. Judgment and thought content normal. Skin: acanthosis nigricans  PHQ2/9: Depression screen [facility] 12/21/2015 09/14/2015 06/29/2015 04/07/2015 01/04/2015  Decreased Interest 0 0 0 0 0  Down, Depressed, Hopeless 0 0 0 0 0  PHQ - 2 Score 0 0 0 0 0    Fall Risk: Fall Risk  12/21/2015 09/14/2015 06/29/2015 04/07/2015 01/04/2015  Falls in the past year? No No No No No     Assessment & Plan   1. Essential (primary) hypertension  Continue medication, bp is at goal, no side effects  2. Adult BMI 30+  - Naltrexone-Bupropion HCl ER (CONTRAVE) 8-90 MG TB12; Take 2 tablets by mouth 2 (two) times daily.  Dispense: 120 tablet; Refill: 2  3. Dysmetabolic syndrome  Last hgbA1c was very good, discussed Saxenda in place of Contrave since she has been forgetting the evening dose. She will try to take it before going home at night  4. Vitamin D deficiency  Continue vitamin D supplementation   5. Perennial allergic rhinitis  - cetirizine (ZYRTEC) 10 MG tablet; Take 1 tablet (10 mg total) by mouth daily.  Dispense: 30 tablet; Refill: 2

## 2016-03-22 ENCOUNTER — Ambulatory Visit: Payer: BC Managed Care – PPO | Admitting: Family Medicine

## 2016-05-06 ENCOUNTER — Encounter: Payer: Self-pay | Admitting: Family Medicine

## 2016-06-18 ENCOUNTER — Ambulatory Visit (INDEPENDENT_AMBULATORY_CARE_PROVIDER_SITE_OTHER): Payer: BC Managed Care – PPO | Admitting: Family Medicine

## 2016-06-18 ENCOUNTER — Encounter: Payer: Self-pay | Admitting: Family Medicine

## 2016-06-18 VITALS — BP 116/72 | HR 81 | Temp 98.4°F | Resp 16 | Ht 61.25 in | Wt 211.4 lb

## 2016-06-18 DIAGNOSIS — Z6839 Body mass index (BMI) 39.0-39.9, adult: Secondary | ICD-10-CM

## 2016-06-18 DIAGNOSIS — Z23 Encounter for immunization: Secondary | ICD-10-CM

## 2016-06-18 DIAGNOSIS — I10 Essential (primary) hypertension: Secondary | ICD-10-CM | POA: Diagnosis not present

## 2016-06-18 DIAGNOSIS — E6609 Other obesity due to excess calories: Secondary | ICD-10-CM | POA: Diagnosis not present

## 2016-06-18 DIAGNOSIS — E8881 Metabolic syndrome: Secondary | ICD-10-CM | POA: Diagnosis not present

## 2016-06-18 MED ORDER — LIRAGLUTIDE -WEIGHT MANAGEMENT 18 MG/3ML ~~LOC~~ SOPN: 0.6000 mg | PEN_INJECTOR | Freq: Every day | SUBCUTANEOUS | 2 refills | Status: DC

## 2016-06-18 NOTE — Progress Notes (Signed)
Name: Kaitlin Schroeder   MRN: [id number]    DOB: 10/09/1980   Date:06/18/2016       Progress Note  Subjective  Chief Complaint  Chief Complaint  Patient presents with  . Medication Refill    3 month F/U  . Hypertension    Denies any symptoms  . Allergic Rhinitis     Eyes have been running and head congestion  . Obesity    HPI  HTN: patient is taking medication and denies side effects, no chest pain, palpitation or SOB. Taking half pill of Maxzide  Perennial Allergic Rhinitis: taking Cetirizine  and singulair daily and nasal spray prn. She has some nasal congestion and runny eye almost every morning, no itching. Only using eye drops prn. She was seen by ophthalmologist   Adult Obesity: insurance stopped paying for Qsymia, her original weight was 218 lbs on 04/2013. She was down to 199 lbs in December 2016 but [facility] a gap on medication and she is up to [employer]205.6lbs Insurance stopped paying for Contrave because she was not losing weight. She has gained a few pounds since last visit. She is still eating healthy and exercising on a regular basis.   Metabolic Syndrome: hgbA1C was at goal in June.  She denies polyphagia, polydipsia or polyuria.  Patient Active Problem List   Diagnosis Date Noted  . Acanthosis nigricans 12/31/2014  . Essential (primary) hypertension 12/31/2014  . Dysmetabolic syndrome 12/31/2014  . Obesity 12/31/2014  . Perennial allergic rhinitis 12/31/2014  . Vitamin D deficiency 12/31/2014    Past Surgical History:  Procedure Laterality Date  . MOUTH SURGERY      Family History  Problem Relation Age of Onset  . Hypertension Mother   . Hypertension Father     Social History   Social History  . Marital status: Single    Spouse name: N/A  . Number of children: N/A  . Years of education: N/A   Occupational History  . Not on file.   Social History Main Topics  . Smoking status: Never Smoker  .  Smokeless tobacco: Never Used  . Alcohol use 0.0 oz/week     Comment: social  . Drug use: No  . Sexual activity: Not Currently   Other Topics Concern  . Not on file   Social History Narrative  . No narrative on file     Current Outpatient Prescriptions:  .  cetirizine (ZYRTEC) 10 MG tablet, Take 1 tablet (10 mg total) by mouth daily., Disp: 30 tablet, Rfl: 2 .  Cholecalciferol (VITAMIN D) 2000 UNITS tablet, Take 1 tablet by mouth daily., Disp: , Rfl:  .  fluticasone (FLONASE) 50 MCG/ACT nasal spray, Place 2 sprays into the nose as needed., Disp: , Rfl:  .  Liraglutide -Weight Management (SAXENDA) 18 MG/3ML SOPN, Inject 0.6-3.2 mg into the skin daily., Disp: 9 mL, Rfl: 2 .  montelukast (SINGULAIR) 10 MG tablet, Take 1 tablet (10 mg total) by mouth daily., Disp: 90 tablet, Rfl: 1 .  Multiple Vitamin tablet, Take 1 tablet by mouth daily., Disp: , Rfl:  .  olopatadine (PATANOL) 0.1 % ophthalmic solution, Place 1 drop into both eyes 2 (two) times daily., Disp: 5 mL, Rfl: 2 .  triamterene-hydrochlorothiazide (MAXZIDE-25) 37.5-25 MG tablet, Take 1 tablet by mouth daily., Disp: 90 tablet, Rfl: 1 .  Vit C-Quercet-Bioflv-Bromelain 450-250-125-50 MG CAPS, Take 1 capsule by mouth daily., Disp: 30 capsule, Rfl: 0  No Known Allergies  ROS  Constitutional: Negative for fever, positive for weight change.  Respiratory: Negative for cough and shortness of breath.   Cardiovascular: Negative for chest pain or palpitations.  Gastrointestinal: Negative for abdominal pain, no bowel changes.  Musculoskeletal: Negative for gait problem or joint swelling.  Skin: Negative for rash.  Neurological: Negative for dizziness or headache.  No other specific complaints in a complete review of systems (except as listed in HPI above).  Objective  Vitals:   06/18/16 0816  BP: 116/72  Pulse: 81  Resp: 16  Temp: 98.4 F (36.9 C)  TempSrc: Oral  SpO2: 96%  Weight: 211 lb 6.4 oz (95.9 kg)  Height: 5' 1.25"  (1.556 m)    Body mass index is 39.62 kg/m.  Physical Exam  Constitutional: Patient appears well-developed and well-nourished. Obese  No distress.  HEENT: head atraumatic, normocephalic, pupils equal and reactive to light, neck supple, throat within normal limits Cardiovascular: Normal rate, regular rhythm and normal heart sounds.  No murmur heard. No BLE edema. Pulmonary/Chest: Effort normal and breath sounds normal. No respiratory distress. Abdominal: Soft.  There is no tenderness. Psychiatric: Patient has a normal mood and affect. behavior is normal. Judgment and thought content normal.  PHQ2/9: Depression screen [facility] 06/18/2016 12/21/2015 09/14/2015 06/29/2015 04/07/2015  Decreased Interest 0 0 0 0 0  Down, Depressed, Hopeless 0 0 0 0 0  PHQ - 2 Score 0 0 0 0 0     Fall Risk: Fall Risk  06/18/2016 12/21/2015 09/14/2015 06/29/2015 04/07/2015  Falls in the past year? No No No No No     Functional Status Survey: Is the patient deaf or have difficulty hearing?: No Does the patient have difficulty seeing, even when wearing glasses/contacts?: No Does the patient have difficulty concentrating, remembering, or making decisions?: No Does the patient have difficulty walking or climbing stairs?: No Does the patient have difficulty dressing or bathing?: No Does the patient have difficulty doing errands alone such as visiting a doctor's office or shopping?: No    Assessment & Plan  1. Essential (primary) hypertension  Well controlled   2. Dysmetabolic syndrome  Last hgbA1C was normal, she is on life style modification. Discussed starting Victoza and she is afraid of using needles, but she will think about it. Also discussed Saxenda  3. Need for Tdap vaccination  - Tdap vaccine greater than or equal to 7yo IM  4. Class 2 obesity due to excess calories with body mass index (BMI) of 39.0 to 39.9 in adult, unspecified whether serious comorbidity present  Discussed all optiosns for  weight loss medications including Belviq, Qsymia, Saxenda and Contrave. Discussed risk and benefits of each of them. She responded well to Qsymia, but no longer covered by insurance. Failed Contrave. Discussed Saxenda and we will try to give her a sample when it arrives, she will think about it. - Liraglutide -Weight Management (SAXENDA) 18 MG/3ML SOPN; Inject 0.6-3.2 mg into the skin daily.  Dispense: 9 mL; Refill: 2

## 2016-07-05 ENCOUNTER — Encounter: Payer: Self-pay | Admitting: Family Medicine

## 2016-07-05 ENCOUNTER — Other Ambulatory Visit: Payer: Self-pay | Admitting: Family Medicine

## 2016-07-05 MED ORDER — LORCASERIN HCL ER 20 MG PO TB24: 1.0000 | ORAL_TABLET | Freq: Every day | ORAL | 1 refills | Status: DC

## 2016-08-10 ENCOUNTER — Encounter: Payer: Self-pay | Admitting: Family Medicine

## 2016-08-12 ENCOUNTER — Other Ambulatory Visit: Payer: Self-pay | Admitting: Family Medicine

## 2016-08-12 MED ORDER — FLUTICASONE PROPIONATE 50 MCG/ACT NA SUSP: 2.0000 | Freq: Every day | NASAL | 2 refills | Status: DC

## 2016-09-16 ENCOUNTER — Encounter: Payer: Self-pay | Admitting: Family Medicine

## 2016-09-16 ENCOUNTER — Ambulatory Visit (INDEPENDENT_AMBULATORY_CARE_PROVIDER_SITE_OTHER): Payer: BC Managed Care – PPO | Admitting: Family Medicine

## 2016-09-16 ENCOUNTER — Other Ambulatory Visit: Payer: Self-pay | Admitting: Family Medicine

## 2016-09-16 VITALS — BP 128/78 | HR 89 | Temp 98.5°F | Resp 16 | Ht 61.0 in | Wt 213.6 lb

## 2016-09-16 DIAGNOSIS — Z124 Encounter for screening for malignant neoplasm of cervix: Secondary | ICD-10-CM | POA: Diagnosis not present

## 2016-09-16 DIAGNOSIS — I10 Essential (primary) hypertension: Secondary | ICD-10-CM

## 2016-09-16 DIAGNOSIS — E6609 Other obesity due to excess calories: Secondary | ICD-10-CM

## 2016-09-16 DIAGNOSIS — Z0000 Encounter for general adult medical examination without abnormal findings: Secondary | ICD-10-CM | POA: Diagnosis not present

## 2016-09-16 DIAGNOSIS — E8881 Metabolic syndrome: Secondary | ICD-10-CM | POA: Diagnosis not present

## 2016-09-16 DIAGNOSIS — Z01419 Encounter for gynecological examination (general) (routine) without abnormal findings: Secondary | ICD-10-CM

## 2016-09-16 DIAGNOSIS — Z13220 Encounter for screening for lipoid disorders: Secondary | ICD-10-CM

## 2016-09-16 DIAGNOSIS — J3089 Other allergic rhinitis: Secondary | ICD-10-CM

## 2016-09-16 DIAGNOSIS — Z6839 Body mass index (BMI) 39.0-39.9, adult: Secondary | ICD-10-CM | POA: Diagnosis not present

## 2016-09-16 LAB — CBC WITH DIFFERENTIAL/PLATELET
BASOS ABS: 0 {cells}/uL (ref 0–200)
Basophils Relative: 0 %
EOS PCT: 3 %
Eosinophils Absolute: 171 cells/uL (ref 15–500)
HCT: 43.5 % (ref 35.0–45.0)
Hemoglobin: 14.4 g/dL (ref 11.7–15.5)
LYMPHS ABS: 2052 {cells}/uL (ref 850–3900)
Lymphocytes Relative: 36 %
MCH: 30.9 pg (ref 27.0–33.0)
MCHC: 33.1 g/dL (ref 32.0–36.0)
MCV: 93.3 fL (ref 80.0–100.0)
MONOS PCT: 7 %
MPV: 9.9 fL (ref 7.5–12.5)
Monocytes Absolute: 399 cells/uL (ref 200–950)
NEUTROS PCT: 54 %
Neutro Abs: 3078 cells/uL (ref 1500–7800)
PLATELETS: 335 10*3/uL (ref 140–400)
RBC: 4.66 MIL/uL (ref 3.80–5.10)
RDW: 13.3 % (ref 11.0–15.0)
WBC: 5.7 10*3/uL (ref 3.8–10.8)

## 2016-09-16 LAB — HEMOGLOBIN A1C
Hgb A1c MFr Bld: 5 % (ref <5.7)
Mean Plasma Glucose: 97 mg/dL

## 2016-09-16 MED ORDER — LORATADINE 10 MG PO TABS: 10.0000 mg | ORAL_TABLET | Freq: Every day | ORAL | 2 refills | Status: AC

## 2016-09-16 MED ORDER — SEMAGLUTIDE (1 MG/DOSE) 2 MG/1.5ML ~~LOC~~ SOPN: 1.0000 mg | PEN_INJECTOR | SUBCUTANEOUS | 2 refills | Status: DC

## 2016-09-16 NOTE — Progress Notes (Signed)
Name: Kaitlin Schroeder   MRN: [id number]    DOB: [birth year]   Date:09/16/2016       Progress Note  Subjective  Chief Complaint  Chief Complaint  Patient presents with  . Annual Exam    HPI  Wellness: never sexually active, no vaginal discharge, regular cycles, lasts 4 days, no breast lumps. Maternal aunt had breast cancer but she was in her 34's.   HTN: patient is taking medication and denies side effects, no chest pain, palpitation or SOB. Taking half pill of Maxzide  Perennial Allergic Rhinitis: taking Loratadineand singulair daily and nasal spray prn. She has some nasal congestion and runny eye almost every morning, no itching or sneezing. Only using eye drops prn.   Adult Obesity: insurance stopped paying for Qsymia, her original weight was 218 lbs on 04/2013. She was down to 199 lbs in December 2016but medication had to be switched to [facility] around Feb 2017 after a gap on medication and she is up to 205.6lbsInsurance stopped paying for Contrave because she was not losing weight. We switched to Belviq in December and she has gained weight, we also gave her samples of Victoza that she tried for a couple of weeks and noticed improvement in her appetite and would like to try a GLP-1 agonist again. No family history of thyroid cancer  Metabolic Syndrome: hgbA1C was at goal in June.  She denies polyphagia, polydipsia or polyuria. She has borderline low HDL, increase in abdominal girth, hypertrichosis, and HTN. We will try Ozempic  Patient Active Problem List   Diagnosis Date Noted  . Acanthosis nigricans 12/31/2014  . Essential (primary) hypertension 12/31/2014  . Dysmetabolic syndrome 12/31/2014  . Obesity 12/31/2014  . Perennial allergic rhinitis 12/31/2014  . Vitamin D deficiency 12/31/2014    Past Surgical History:  Procedure Laterality Date  . MOUTH SURGERY      Family History  Problem Relation Age of Onset  . Hypertension Mother   . Hypertension Father     Social  History   Social History  . Marital status: Single    Spouse name: N/A  . Number of children: N/A  . Years of education: N/A   Occupational History  . Not on file.   Social History Main Topics  . Smoking status: Never Smoker  . Smokeless tobacco: Never Used  . Alcohol use 0.0 oz/week     Comment: social  . Drug use: No  . Sexual activity: Not Currently   Other Topics Concern  . Not on file   Social History Narrative  . No narrative on file     Current Outpatient Prescriptions:  .  Cholecalciferol (VITAMIN D) 2000 UNITS tablet, Take 1 tablet by mouth daily., Disp: , Rfl:  .  fluticasone (FLONASE) 50 MCG/ACT nasal spray, Place 2 sprays into both nostrils daily., Disp: 16 g, Rfl: 2 .  loratadine (CLARITIN) 10 MG tablet, Take 1 tablet (10 mg total) by mouth daily., Disp: 100 tablet, Rfl: 2 .  montelukast (SINGULAIR) 10 MG tablet, Take 1 tablet (10 mg total) by mouth daily., Disp: 90 tablet, Rfl: 1 .  Multiple Vitamin tablet, Take 1 tablet by mouth daily., Disp: , Rfl:  .  olopatadine (PATANOL) 0.1 % ophthalmic solution, Place 1 drop into both eyes 2 (two) times daily., Disp: 5 mL, Rfl: 2 .  Semaglutide (OZEMPIC) 1 MG/DOSE SOPN, Inject 1 mg into the skin once a week., Disp: 2 pen, Rfl: 2 .  triamterene-hydrochlorothiazide (MAXZIDE-25) 37.5-25 MG tablet, Take 1 tablet  by mouth daily., Disp: 90 tablet, Rfl: 1 .  Vit C-Quercet-Bioflv-Bromelain 450-250-125-50 MG CAPS, Take 1 capsule by mouth daily., Disp: 30 capsule, Rfl: 0  No Known Allergies   ROS  Constitutional: Negative for fever, mild  weight change.  Respiratory: Negative for cough and shortness of breath.   Cardiovascular: Negative for chest pain or palpitations.  Gastrointestinal: Negative for abdominal pain, no bowel changes.  Musculoskeletal: Negative for gait problem or joint swelling.  Skin: Negative for rash.  Neurological: Negative for dizziness or headache.  No other specific complaints in a complete review of  systems (except as listed in HPI above).  Objective  Vitals:   09/16/16 0817  BP: 128/78  Pulse: 89  Resp: 16  Temp: 98.5 F (36.9 C)  SpO2: 97%  Weight: 213 lb 9 oz (96.9 kg)  Height: 5\' 1"  (1.549 m)    Body mass index is 40.35 kg/m.  Physical Exam  Constitutional: Patient appears well-developed and well-nourished. No distress.  HENT: Head: Normocephalic and atraumatic. Ears: B TMs ok, no erythema or effusion; Nose: Nose normal. Mouth/Throat: Oropharynx is clear and moist. No oropharyngeal exudate.  Eyes: Conjunctivae and EOM are normal. Pupils are equal, round, and reactive to light. No scleral icterus.  Neck: Normal range of motion. Neck supple. No JVD present. No thyromegaly present.  Cardiovascular: Normal rate, regular rhythm and normal heart sounds.  No murmur heard. No BLE edema. Pulmonary/Chest: Effort normal and breath sounds normal. No respiratory distress. Abdominal: Soft. Bowel sounds are normal, no distension. There is no tenderness. no masses Breast: no lumps or masses, no nipple discharge or rashes FEMALE GENITALIA:  External genitalia normal External urethra normal Vaginal vault normal without discharge or lesions Cervix normal without discharge or lesions Bimanual exam normal without masses RECTAL: not done Musculoskeletal: Normal range of motion, no joint effusions. No gross deformities Neurological: he is alert and oriented to person, place, and time. No cranial nerve deficit. Coordination, balance, strength, speech and gait are normal.  Skin: Skin is warm and dry. No rash noted. No erythema.  Psychiatric: Patient has a normal mood and affect. behavior is normal. Judgment and thought content normal.  PHQ2/9: Depression screen [facility] 2/9 09/16/2016 06/18/2016 12/21/2015 09/14/2015 06/29/2015  Decreased Interest 0 0 0 0 0  Down, Depressed, Hopeless 0 0 0 0 0  PHQ - 2 Score 0 0 0 0 0    Fall Risk: Fall Risk  09/16/2016 06/18/2016 12/21/2015 09/14/2015 06/29/2015   Falls in the past year? No No No No No     Assessment & Plan  1. Well woman exam  Discussed importance of 150 minutes of physical activity weekly, eat two servings of fish weekly, eat one serving of tree nuts ( cashews, pistachios, pecans, almonds.Marland Kitchen) every other day, eat 6 servings of fruit/vegetables daily and drink plenty of water and avoid sweet beverages.  - VITAMIN D 25 Hydroxy (Vit-D Deficiency, Fractures) - Vitamin B12  2. Cervical cancer screening  - Pap IG and HPV (high risk) DNA detection  3. Perennial allergic rhinitis  - loratadine (CLARITIN) 10 MG tablet; Take 1 tablet (10 mg total) by mouth daily.  Dispense: 100 tablet; Refill: 2  4. Class 2 obesity due to excess calories with body mass index (BMI) of 39.0 to 39.9 in adult, unspecified whether serious comorbidity present  She tried Victoza in the past and it decreased her appetite, she is willing to try once a week since Belviq is no longer working - Semaglutide (OZEMPIC) 1  MG/DOSE SOPN; Inject 1 mg into the skin once a week.  Dispense: 2 pen; Refill: 2 - Insulin, fasting - TSH  5. Dysmetabolic syndrome  - Semaglutide (OZEMPIC) 1 MG/DOSE SOPN; Inject 1 mg into the skin once a week.  Dispense: 2 pen; Refill: 2 - Insulin, fasting - Hemoglobin A1c  6. Essential (primary) hypertension  - COMPLETE METABOLIC PANEL WITH GFR - CBC with Differential/Platelet  7. Lipid screening  - Lipid panel

## 2016-09-17 LAB — LIPID PANEL
CHOL/HDL RATIO: 3.5 ratio (ref <5.0)
CHOLESTEROL: 143 mg/dL (ref <200)
HDL: 41 mg/dL — ABNORMAL LOW (ref >50)
LDL CALC: 80 mg/dL (ref <100)
TRIGLYCERIDES: 109 mg/dL (ref <150)
VLDL: 22 mg/dL (ref <30)

## 2016-09-17 LAB — COMPLETE METABOLIC PANEL WITH GFR
ALBUMIN: 4.2 g/dL (ref 3.6–5.1)
ALK PHOS: 44 U/L (ref 33–115)
ALT: 18 U/L (ref 6–29)
AST: 17 U/L (ref 10–30)
BILIRUBIN TOTAL: 0.3 mg/dL (ref 0.2–1.2)
BUN: 9 mg/dL (ref 7–25)
CALCIUM: 9.6 mg/dL (ref 8.6–10.2)
CO2: 26 mmol/L (ref 20–31)
Chloride: 105 mmol/L (ref 98–110)
Creat: 0.79 mg/dL (ref 0.50–1.10)
GFR, Est African American: >89 mL/min (ref >=60)
GLUCOSE: 80 mg/dL (ref 65–99)
POTASSIUM: 3.8 mmol/L (ref 3.5–5.3)
SODIUM: 139 mmol/L (ref 135–146)
TOTAL PROTEIN: 7 g/dL (ref 6.1–8.1)

## 2016-09-17 LAB — TSH: TSH: 0.92 mIU/L

## 2016-09-17 LAB — VITAMIN B12: VITAMIN B 12: 941 pg/mL (ref 200–1100)

## 2016-09-17 LAB — VITAMIN D 25 HYDROXY (VIT D DEFICIENCY, FRACTURES): VIT D 25 HYDROXY: 41 ng/mL (ref 30–100)

## 2016-09-17 LAB — INSULIN, FASTING: Insulin fasting, serum: 9.8 u[IU]/mL (ref 2.0–19.6)

## 2016-09-20 LAB — PAP IG AND HPV HIGH-RISK: HPV DNA HIGH RISK: NOT DETECTED

## 2016-10-14 ENCOUNTER — Other Ambulatory Visit: Payer: Self-pay | Admitting: Family Medicine

## 2016-10-14 DIAGNOSIS — J3089 Other allergic rhinitis: Secondary | ICD-10-CM

## 2016-11-20 ENCOUNTER — Encounter: Payer: Self-pay | Admitting: Family Medicine

## 2016-11-26 ENCOUNTER — Encounter: Payer: Self-pay | Admitting: Family Medicine

## 2016-11-26 ENCOUNTER — Ambulatory Visit (INDEPENDENT_AMBULATORY_CARE_PROVIDER_SITE_OTHER): Payer: BC Managed Care – PPO | Admitting: Family Medicine

## 2016-11-26 VITALS — BP 126/73 | HR 77 | Temp 98.5°F | Resp 16 | Ht 61.0 in | Wt 206.6 lb

## 2016-11-26 DIAGNOSIS — E6609 Other obesity due to excess calories: Secondary | ICD-10-CM | POA: Diagnosis not present

## 2016-11-26 DIAGNOSIS — E8881 Metabolic syndrome: Secondary | ICD-10-CM | POA: Diagnosis not present

## 2016-11-26 DIAGNOSIS — J3089 Other allergic rhinitis: Secondary | ICD-10-CM | POA: Diagnosis not present

## 2016-11-26 DIAGNOSIS — Z6839 Body mass index (BMI) 39.0-39.9, adult: Secondary | ICD-10-CM

## 2016-11-26 DIAGNOSIS — I10 Essential (primary) hypertension: Secondary | ICD-10-CM | POA: Diagnosis not present

## 2016-11-26 NOTE — Progress Notes (Signed)
Name: Kaitlin Schroeder   MRN: [id number]    DOB: [birth year]   Date:11/26/2016       Progress Note  Subjective  Chief Complaint  Chief Complaint  Patient presents with  . Follow-up    6 mo  . Medication Refill    HPI  HTN: patient is taking medication and denies side effects, no chest pain, palpitation or SOB, or swelling. Taking half pill of Maxzide.   Perennial Allergic Rhinitis: taking Loratitdineand singulair daily and nasal spray prn. She has some rhinorrhea and runny eye almost every morning, no itching/sneezing. Only using eye drops prn.  Adult Obesity: insurance stopped paying for Qsymia, her original weight was 218 lbs on 04/2013. She was down to 199 lbs in December 2016but medication had to be switched to [facility] around Feb 2017 after a gap on medication and she is up to 205.6lbsInsurance stopped paying for Contrave because she was not losing weight. At last visit she was put on Ozempic - has been feeling good on the medication, feeling very full and mild nausea that signals her to stop eating.  Has used Belviq in the past too and said it did not do anything. She has lost a few pounds since last visit. She is still eating healthy (Eating breakfast, has good protein in her diet, prepared lunches, healthy dinners at home) and exercising (45mn/day 5days/week - Does beach body workouts, Zumba, bike rides) on a regular basis.   Metabolic Syndrome: [id number] at goal in March 2018 (5.0%).  She denies polyphagia, polydipsia or polyuria. Last eye appointment was about 1.5 years ago  Patient Active Problem List   Diagnosis Date Noted  . Acanthosis nigricans 12/31/2014  . Essential (primary) hypertension 12/31/2014  . Dysmetabolic syndrome 091/47/8295 . Obesity 12/31/2014  . Perennial allergic rhinitis 12/31/2014  . Vitamin D deficiency 12/31/2014    Past Surgical History:  Procedure Laterality Date  . MOUTH SURGERY      Family History  Problem Relation Age of Onset  .  Hypertension Mother   . Hypertension Father     Social History   Social History  . Marital status: Single    Spouse name: N/A  . Number of children: N/A  . Years of education: N/A   Occupational History  . Not on file.   Social History Main Topics  . Smoking status: Never Smoker  . Smokeless tobacco: Never Used  . Alcohol use 0.0 oz/week     Comment: social  . Drug use: No  . Sexual activity: Not Currently   Other Topics Concern  . Not on file   Social History Narrative  . No narrative on file     Current Outpatient Prescriptions:  .  Cholecalciferol (VITAMIN D) 2000 UNITS tablet, Take 1 tablet by mouth daily., Disp: , Rfl:  .  fluticasone (FLONASE) 50 MCG/ACT nasal spray, Place 2 sprays into both nostrils daily., Disp: 16 g, Rfl: 2 .  loratadine (CLARITIN) 10 MG tablet, Take 1 tablet (10 mg total) by mouth daily., Disp: 100 tablet, Rfl: 2 .  montelukast (SINGULAIR) 10 MG tablet, Take 1 tablet (10 mg total) by mouth daily., Disp: 90 tablet, Rfl: 1 .  montelukast (SINGULAIR) 10 MG tablet, TAKE ONE TABLET BY MOUTH ONCE DAILY, Disp: 90 tablet, Rfl: 0 .  Multiple Vitamin tablet, Take 1 tablet by mouth daily., Disp: , Rfl:  .  olopatadine (PATANOL) 0.1 % ophthalmic solution, Place 1 drop into both eyes 2 (two) times daily., Disp: 5 mL,  Rfl: 2 .  Semaglutide (OZEMPIC) 1 MG/DOSE SOPN, Inject 1 mg into the skin once a week., Disp: 2 pen, Rfl: 2 .  triamterene-hydrochlorothiazide (MAXZIDE-25) 37.5-25 MG tablet, Take 1 tablet by mouth daily., Disp: 90 tablet, Rfl: 1 .  Vit C-Quercet-Bioflv-Bromelain 450-250-125-50 MG CAPS, Take 1 capsule by mouth daily. (Patient not taking: Reported on 11/26/2016), Disp: 30 capsule, Rfl: 0  No Known Allergies   ROS  Constitutional: Negative for fever; Has lost 7lbs since last visit.  Respiratory: Negative for cough and shortness of breath.   Cardiovascular: Negative for chest pain or palpitations.  Gastrointestinal: Negative for abdominal  pain, no bowel changes.  Musculoskeletal: Negative for gait problem or joint swelling.   Neurological: Negative for dizziness or headache.  No other specific complaints in a complete review of systems (except as listed in HPI above).  Objective  Vitals:   11/26/16 0757  BP: 126/73  Pulse: 77  Resp: 16  Temp: 98.5 F (36.9 C)  TempSrc: Oral  SpO2: 97%  Weight: 206 lb 9.6 oz (93.7 kg)  Height: 5' 1"  (1.549 m)    Body mass index is 39.04 kg/m.  Physical Exam Constitutional: Patient appears well-developed and well-nourished. Obese, No distress.  HEENT: head atraumatic, normocephalic, pupils equal and reactive to light, neck supple, throat within normal limits Cardiovascular: Normal rate, regular rhythm and normal heart sounds.  No murmur heard. No BLE edema. Pulmonary/Chest: Effort normal and breath sounds normal. No respiratory distress. Abdominal: Soft.  There is no tenderness. Psychiatric: Patient has a normal mood and affect. behavior is normal. Judgment and thought content normal.  Recent Results (from the past 2160 hour(s))  Insulin, fasting     Status: None   Collection Time: 09/16/16  9:23 AM  Result Value Ref Range   Insulin fasting, serum 9.8 2.0 - 19.6 uIU/mL    Comment:   This insulin assay shows strong cross-reactivity for some insulin analogs (lispro, aspart, and glargine) and much lower cross-reactivity with others (detemir, glulisine).   Stimulated Insulin reference intervals were established using the Siemens Immulite assay. These values are provided for general guidance only.   Hemoglobin A1c     Status: None   Collection Time: 09/16/16  9:23 AM  Result Value Ref Range   Hgb A1c MFr Bld 5.0 <5.7 %    Comment:   For the purpose of screening for the presence of diabetes:   <5.7%       Consistent with the absence of diabetes 5.7-6.4 %   Consistent with increased risk for diabetes (prediabetes) >=6.5 %     Consistent with diabetes   This assay result  is consistent with a decreased risk of diabetes.   Currently, no consensus exists regarding use of hemoglobin A1c for diagnosis of diabetes in children.   According to American Diabetes Association (ADA) guidelines, hemoglobin A1c <7.0% represents optimal control in non-pregnant diabetic patients. Different metrics may apply to specific patient populations. Standards of Medical Care in Diabetes (ADA).      Mean Plasma Glucose 97 mg/dL  Lipid panel     Status: Abnormal   Collection Time: 09/16/16  9:23 AM  Result Value Ref Range   Cholesterol 143 <200 mg/dL   Triglycerides 109 <150 mg/dL   HDL 41 (L) >50 mg/dL   Total CHOL/HDL Ratio 3.5 <5.0 Ratio   VLDL 22 <30 mg/dL   LDL Cholesterol 80 <100 mg/dL  COMPLETE METABOLIC PANEL WITH GFR     Status: None   Collection Time:  09/16/16  9:23 AM  Result Value Ref Range   Sodium 139 135 - 146 mmol/L   Potassium 3.8 3.5 - 5.3 mmol/L   Chloride 105 98 - 110 mmol/L   CO2 26 20 - 31 mmol/L   Glucose, Bld 80 65 - 99 mg/dL   BUN 9 7 - 25 mg/dL   Creat 0.79 0.50 - 1.10 mg/dL   Total Bilirubin 0.3 0.2 - 1.2 mg/dL   Alkaline Phosphatase 44 33 - 115 U/L   AST 17 10 - 30 U/L   ALT 18 6 - 29 U/L   Total Protein 7.0 6.1 - 8.1 g/dL   Albumin 4.2 3.6 - 5.1 g/dL   Calcium 9.6 8.6 - 10.2 mg/dL   GFR, Est African American >89 >=60 mL/min   GFR, Est Non African American >89 >=60 mL/min  CBC with Differential/Platelet     Status: None   Collection Time: 09/16/16  9:23 AM  Result Value Ref Range   WBC 5.7 3.8 - 10.8 K/uL   RBC 4.66 3.80 - 5.10 MIL/uL   Hemoglobin 14.4 11.7 - 15.5 g/dL   HCT 43.5 35.0 - 45.0 %   MCV 93.3 80.0 - 100.0 fL   MCH 30.9 27.0 - 33.0 pg   MCHC 33.1 32.0 - 36.0 g/dL   RDW 13.3 11.0 - 15.0 %   Platelets 335 140 - 400 K/uL   MPV 9.9 7.5 - 12.5 fL   Neutro Abs 3,078 1,500 - 7,800 cells/uL   Lymphs Abs 2,052 850 - 3,900 cells/uL   Monocytes Absolute 399 200 - 950 cells/uL   Eosinophils Absolute 171 15 - 500 cells/uL    Basophils Absolute 0 0 - 200 cells/uL   Neutrophils Relative % 54 %   Lymphocytes Relative 36 %   Monocytes Relative 7 %   Eosinophils Relative 3 %   Basophils Relative 0 %   Smear Review Criteria for review not met   VITAMIN D 25 Hydroxy (Vit-D Deficiency, Fractures)     Status: None   Collection Time: 09/16/16  9:23 AM  Result Value Ref Range   Vit D, 25-Hydroxy 41 30 - 100 ng/mL    Comment: Vitamin D Status           25-OH Vitamin D        Deficiency                <20 ng/mL        Insufficiency         20 - 29 ng/mL        Optimal             > or = 30 ng/mL   For 25-OH Vitamin D testing on patients on D2-supplementation and patients for whom quantitation of D2 and D3 fractions is required, the QuestAssureD 25-OH VIT D, (D2,D3), LC/MS/MS is recommended: order code [phone] (patients > 2 yrs).   Vitamin B12     Status: None   Collection Time: 09/16/16  9:23 AM  Result Value Ref Range   Vitamin B-12 941 200 - 1,100 pg/mL  TSH     Status: None   Collection Time: 09/16/16  9:23 AM  Result Value Ref Range   TSH 0.92 mIU/L    Comment:   Reference Range   > or = 20 Years  0.40-4.50   Pregnancy Range First trimester  0.26-2.66 Second trimester 0.55-2.73 Third trimester  0.43-2.91     Pap IG and HPV (high risk)  DNA detection     Status: None   Collection Time: 09/16/16  9:51 AM  Result Value Ref Range   HPV DNA High Risk Not Detected     Comment: HIGH RISK HPV types (16,18,31,33,35,39,45,51,52,56,58,59,66,68) were not detected. Other HPV types which cause anogenital lesions may be present. The significance of the other types of HPV in malignant  processes has not been established.                  ** Normal Reference Range: Not Detected **      HPV High Risk testing performed using the APTIMA HPV mRNA Assay.      Specimen adequacy:      Comment: SATISFACTORY.  Endocervical/transformation zone component present.   FINAL DIAGNOSIS:      Comment: - NEGATIVE FOR  INTRAEPITHELIAL LESIONS OR MALIGNANCY.    COMMENTS:      Comment: This Pap test has been evaluated with computer assisted technology.   Cytotechnologist:      Comment: JRW, BS CT(ASCP)   Pathologist:      Comment: Reviewed by Tawana Scale. Fields, MD, FCAP Paramedic on File) *  The Pap is a screening test for cervical cancer. It is not a  diagnostic test and is subject to false negative and false positive  results. It is most reliable when a satisfactory sample, regularly  obtained, is submitted with relevant clinical findings and history,  and when the Pap result is evaluated along with historic and current  clinical information.    PHQ2/9: Depression screen [facility] 2/9 11/26/2016 09/16/2016 06/18/2016 12/21/2015 09/14/2015  Decreased Interest 0 0 0 0 0  Down, Depressed, Hopeless 0 0 0 0 0  PHQ - 2 Score 0 0 0 0 0   Fall Risk: Fall Risk  11/26/2016 09/16/2016 06/18/2016 12/21/2015 09/14/2015  Falls in the past year? No No No No No   Functional Status Survey: Is the patient deaf or have difficulty hearing?: No Does the patient have difficulty seeing, even when wearing glasses/contacts?: No Does the patient have difficulty concentrating, remembering, or making decisions?: No Does the patient have difficulty walking or climbing stairs?: No Does the patient have difficulty dressing or bathing?: No Does the patient have difficulty doing errands alone such as visiting a doctor's office or shopping?: No  Assessment & Plan  1. Perennial allergic rhinitis Stable, continue medication regimen  2. Essential (primary) hypertension Stable, continue Maxide, will call when she needs refills  3. Dysmetabolic syndrome Stable, continue lifestyle modifications and weight management  4. Class 2 obesity due to excess calories with body mass index (BMI) of 39.0 to 39.9 in adult, unspecified whether serious comorbidity present - Continue Ozempic  Saw patient with Raelyn Ensign, [facility]

## 2016-11-28 MED ORDER — SEMAGLUTIDE(0.25 OR 0.5MG/DOS) 2 MG/1.5ML ~~LOC~~ SOPN: 0.5000 mg | PEN_INJECTOR | SUBCUTANEOUS | 1 refills | Status: DC

## 2016-11-28 NOTE — Addendum Note (Signed)
Addended by: Doren CustardBOYCE, EMILY E on: 11/28/2016 08:11 AM   Modules accepted: Orders

## 2016-12-21 ENCOUNTER — Encounter: Payer: Self-pay | Admitting: Family Medicine

## 2016-12-23 ENCOUNTER — Other Ambulatory Visit: Payer: Self-pay | Admitting: Family Medicine

## 2016-12-23 DIAGNOSIS — Z6839 Body mass index (BMI) 39.0-39.9, adult: Principal | ICD-10-CM

## 2016-12-23 DIAGNOSIS — E6609 Other obesity due to excess calories: Secondary | ICD-10-CM

## 2016-12-23 MED ORDER — SEMAGLUTIDE (1 MG/DOSE) 2 MG/1.5ML ~~LOC~~ SOPN: 1.0000 mg | PEN_INJECTOR | SUBCUTANEOUS | 2 refills | Status: DC

## 2017-01-12 ENCOUNTER — Other Ambulatory Visit: Payer: Self-pay | Admitting: Family Medicine

## 2017-01-12 DIAGNOSIS — J3089 Other allergic rhinitis: Secondary | ICD-10-CM

## 2017-03-01 ENCOUNTER — Other Ambulatory Visit: Payer: Self-pay | Admitting: Family Medicine

## 2017-03-01 DIAGNOSIS — I10 Essential (primary) hypertension: Secondary | ICD-10-CM

## 2017-03-11 ENCOUNTER — Ambulatory Visit (INDEPENDENT_AMBULATORY_CARE_PROVIDER_SITE_OTHER): Payer: BC Managed Care – PPO | Admitting: Family Medicine

## 2017-03-11 ENCOUNTER — Encounter: Payer: Self-pay | Admitting: Family Medicine

## 2017-03-11 VITALS — BP 123/68 | HR 85 | Temp 98.6°F | Resp 16 | Ht 61.0 in | Wt 205.1 lb

## 2017-03-11 DIAGNOSIS — E6609 Other obesity due to excess calories: Secondary | ICD-10-CM

## 2017-03-11 DIAGNOSIS — J3089 Other allergic rhinitis: Secondary | ICD-10-CM

## 2017-03-11 DIAGNOSIS — I10 Essential (primary) hypertension: Secondary | ICD-10-CM | POA: Diagnosis not present

## 2017-03-11 DIAGNOSIS — E8881 Metabolic syndrome: Secondary | ICD-10-CM | POA: Diagnosis not present

## 2017-03-11 DIAGNOSIS — Z6839 Body mass index (BMI) 39.0-39.9, adult: Secondary | ICD-10-CM

## 2017-03-11 MED ORDER — SEMAGLUTIDE (1 MG/DOSE) 2 MG/1.5ML ~~LOC~~ SOPN: 1.0000 mg | PEN_INJECTOR | SUBCUTANEOUS | 2 refills | Status: DC

## 2017-03-11 MED ORDER — MONTELUKAST SODIUM 10 MG PO TABS: 10.0000 mg | ORAL_TABLET | Freq: Every day | ORAL | 1 refills | Status: DC

## 2017-03-11 NOTE — Progress Notes (Signed)
Name: Kaitlin Schroeder   MRN: [id number]    DOB: January 14, 1981   Date:03/11/2017       Progress Note  Subjective  Chief Complaint  Chief Complaint  Patient presents with  . Follow-up    3 mo  . Medication Refill    HPI   HTN: patient is taking medication and denies side effects, no chest pain, palpitation or SOB, or swelling.Taking half pill of Maxzide and bp is at goal   Perennial Allergic Rhinitis: taking Loratitdineand singulair daily and nasal spray prn. She has occasionall  Rhinorrhea and nasal congestion but has  runny eye almost every morning, no itching/sneezing. Only using eye drops prn.  Adult Obesity: insurance stopped paying for Qsymia, her original weight was 218 lbs on 04/2013. She was down to 199 lbs in December 2016but medication had to be switched to [facility] around Feb 2017 after a gap on medication and she is up to 205.6lbsInsurance stopped paying for Contrave because she was not losing weight.   Has used Belviq in the past too and said it did not do anything. She has been on Ozempic since Spring of 2018. She is still eating healthy (Eating breakfast, has good protein in her diet, prepared lunches, healthy dinners at home) and exercising (95min/day 5days/week - Does beach body workouts, Zumba, bike rides) on a regular basis. Discussed starting a running regiment to help with weight loss.   Metabolic Syndrome: hgbA1C was at goal in March 2018 (5.0%). She denies polyphagia, polydipsia or polyuria. She is now on Ozempic, lost 2 lbs since last visit, but states going back and forth on 0.5 and 1 mg of Ozempic because of samples that she had it.   Patient Active Problem List   Diagnosis Date Noted  . Acanthosis nigricans 12/31/2014  . Essential (primary) hypertension 12/31/2014  . Dysmetabolic syndrome 12/31/2014  . Obesity 12/31/2014  . Perennial allergic rhinitis 12/31/2014  . Vitamin D deficiency 12/31/2014    Past Surgical History:  Procedure Laterality Date  .  MOUTH SURGERY      Family History  Problem Relation Age of Onset  . Hypertension Mother   . Hypertension Father     Social History   Social History  . Marital status: Single    Spouse name: N/A  . Number of children: N/A  . Years of education: N/A   Occupational History  . Not on file.   Social History Main Topics  . Smoking status: Never Smoker  . Smokeless tobacco: Never Used  . Alcohol use 0.0 oz/week     Comment: social  . Drug use: No  . Sexual activity: Not Currently   Other Topics Concern  . Not on file   Social History Narrative  . No narrative on file     Current Outpatient Prescriptions:  .  Cholecalciferol (VITAMIN D) 2000 UNITS tablet, Take 1 tablet by mouth daily., Disp: , Rfl:  .  fluticasone (FLONASE) 50 MCG/ACT nasal spray, Place 2 sprays into both nostrils daily., Disp: 16 g, Rfl: 2 .  loratadine (CLARITIN) 10 MG tablet, Take 1 tablet (10 mg total) by mouth daily., Disp: 100 tablet, Rfl: 2 .  montelukast (SINGULAIR) 10 MG tablet, Take 1 tablet (10 mg total) by mouth daily., Disp: 90 tablet, Rfl: 1 .  Multiple Vitamin tablet, Take 1 tablet by mouth daily., Disp: , Rfl:  .  olopatadine (PATANOL) 0.1 % ophthalmic solution, Place 1 drop into both eyes 2 (two) times daily., Disp: 5 mL, Rfl: 2 .  Semaglutide (OZEMPIC) 1 MG/DOSE SOPN, Inject 1 mg into the skin once a week., Disp: 2 pen, Rfl: 2 .  triamterene-hydrochlorothiazide (MAXZIDE-25) 37.5-25 MG tablet, TAKE ONE TABLET BY MOUTH ONCE DAILY, Disp: 90 tablet, Rfl: 1  No Known Allergies   ROS  Ten systems reviewed and is negative except as mentioned in HPI   Objective  Vitals:   03/11/17 0757  BP: 123/68  Pulse: 85  Resp: 16  Temp: 98.6 F (37 C)  TempSrc: Oral  SpO2: 97%  Weight: 205 lb 1.6 oz (93 kg)  Height: 5\' 1"  (1.549 m)    Body mass index is 38.75 kg/m.  Physical Exam  Constitutional: Patient appears well-developed and well-nourished. Obese  No distress.  HEENT: head  atraumatic, normocephalic, pupils equal and reactive to light,  neck supple, throat within normal limits Cardiovascular: Normal rate, regular rhythm and normal heart sounds.  No murmur heard. No BLE edema. Pulmonary/Chest: Effort normal and breath sounds normal. No respiratory distress. Abdominal: Soft.  There is no tenderness. Psychiatric: Patient has a normal mood and affect. behavior is normal. Judgment and thought content normal.  PHQ2/9: Depression screen [facility] 2/9 03/11/2017 11/26/2016 09/16/2016 06/18/2016 12/21/2015  Decreased Interest 0 0 0 0 0  Down, Depressed, Hopeless 0 0 0 0 0  PHQ - 2 Score 0 0 0 0 0     Fall Risk: Fall Risk  03/11/2017 11/26/2016 09/16/2016 06/18/2016 12/21/2015  Falls in the past year? Yes No No No No  Number falls in past yr: 1 - - - -  Injury with Fall? Yes - - - -  Comment bruise on leg - - - -  Risk for fall due to : Other (Comment) - - - -  Risk for fall due to: Comment pt fell through a deck - - - -  Follow up Falls evaluation completed - - - -     Functional Status Survey: Is the patient deaf or have difficulty hearing?: No Does the patient have difficulty seeing, even when wearing glasses/contacts?: No Does the patient have difficulty concentrating, remembering, or making decisions?: No Does the patient have difficulty walking or climbing stairs?: No Does the patient have difficulty dressing or bathing?: No Does the patient have difficulty doing errands alone such as visiting a doctor's office or shopping?: No    Assessment & Plan  1. Essential (primary) hypertension  Well controlled with medication   2. Dysmetabolic syndrome  - Semaglutide (OZEMPIC) 1 MG/DOSE SOPN; Inject 1 mg into the skin once a week.  Dispense: 2 pen; Refill: 2  3. Class 2 obesity due to excess calories with body mass index (BMI) of 39.0 to 39.9 in adult, unspecified whether serious comorbidity present  Doing well on Ozempic, she states that she was on 0.5 mg dose but last  rx was 1 mg dose and lost 2 lbs since last visit.   4. Perennial allergic rhinitis  - montelukast (SINGULAIR) 10 MG tablet; Take 1 tablet (10 mg total) by mouth daily.  Dispense: 90 tablet; Refill: 1

## 2017-05-04 ENCOUNTER — Encounter: Payer: Self-pay | Admitting: Family Medicine

## 2017-06-17 ENCOUNTER — Other Ambulatory Visit: Payer: Self-pay | Admitting: Family Medicine

## 2017-06-17 DIAGNOSIS — E8881 Metabolic syndrome: Secondary | ICD-10-CM

## 2017-06-17 NOTE — Telephone Encounter (Signed)
Refill request for general medication: Ozempic 1 mg  Last office visit: 03/11/2017  Last physical exam: 09/16/2016  Follow up visit: 06/18/2017

## 2017-06-18 ENCOUNTER — Ambulatory Visit: Payer: BC Managed Care – PPO | Admitting: Family Medicine

## 2017-06-18 ENCOUNTER — Encounter: Payer: Self-pay | Admitting: Family Medicine

## 2017-06-18 VITALS — BP 116/86 | HR 89 | Resp 12 | Ht 61.0 in | Wt 204.5 lb

## 2017-06-18 DIAGNOSIS — E559 Vitamin D deficiency, unspecified: Secondary | ICD-10-CM | POA: Diagnosis not present

## 2017-06-18 DIAGNOSIS — J3089 Other allergic rhinitis: Secondary | ICD-10-CM | POA: Diagnosis not present

## 2017-06-18 DIAGNOSIS — Z6839 Body mass index (BMI) 39.0-39.9, adult: Secondary | ICD-10-CM | POA: Diagnosis not present

## 2017-06-18 DIAGNOSIS — E6609 Other obesity due to excess calories: Secondary | ICD-10-CM | POA: Diagnosis not present

## 2017-06-18 DIAGNOSIS — I10 Essential (primary) hypertension: Secondary | ICD-10-CM | POA: Diagnosis not present

## 2017-06-18 DIAGNOSIS — E8881 Metabolic syndrome: Secondary | ICD-10-CM

## 2017-06-18 MED ORDER — FLUTICASONE PROPIONATE 50 MCG/ACT NA SUSP: 2.0000 | Freq: Every day | NASAL | 2 refills | Status: DC

## 2017-06-18 MED ORDER — SEMAGLUTIDE (1 MG/DOSE) 2 MG/1.5ML ~~LOC~~ SOPN: 1.0000 mg | PEN_INJECTOR | SUBCUTANEOUS | 5 refills | Status: DC

## 2017-06-18 MED ORDER — MONTELUKAST SODIUM 10 MG PO TABS: 10.0000 mg | ORAL_TABLET | Freq: Every day | ORAL | 1 refills | Status: DC

## 2017-06-18 NOTE — Progress Notes (Signed)
Name: Kaitlin Schroeder   MRN: [id number]    DOB: 25-Dec-1980   Date:06/18/2017       Progress Note  Subjective  Chief Complaint  Chief Complaint  Patient presents with  . Obesity    HPI   HTN: patient is taking medication and denies side effects, no chest pain, palpitation or SOB. Taking half pill of Maxzide and bp has been at goal.  Perennial Allergic Rhinitis: taking Cetirizine and singulair daily and nasal spray prn. She has some nasal congestion and runny eye almost every morning, no itching.   Adult Obesity: insurance stopped paying for Qsymia, her original weight was 218 lbs on 04/2013. She was down to 199 lbs in December 2016but [facility] after a gap on medication and she is up to 205.6lbsInsurance stopped paying for Contrave because she was not losing weight. She was given Victoza Dec 2017 weight of 21 lbs, did not lose much weight, started on Ozempic 09/2016 at 213 lbs and has been stable at 205 range since 02/2017 , she still eats healthy, exercises on a regular basis and feels well.   Metabolic Syndrome: hgbA1C was at goal in June, recheck yearly.  She denies polyphagia, polydipsia or polyuria. She is on Ozempic.   Patient Active Problem List   Diagnosis Date Noted  . Acanthosis nigricans 12/31/2014  . Essential (primary) hypertension 12/31/2014  . Dysmetabolic syndrome 12/31/2014  . Obesity 12/31/2014  . Perennial allergic rhinitis 12/31/2014  . Vitamin D deficiency 12/31/2014    Past Surgical History:  Procedure Laterality Date  . MOUTH SURGERY      Family History  Problem Relation Age of Onset  . Hypertension Mother   . Hypertension Father     Social History   Socioeconomic History  . Marital status: Single    Spouse name: Not on file  . Number of children: Not on file  . Years of education: Not on file  . Highest education level: Not on file  Social Needs  . Financial resource strain: Not on file  .  Food insecurity - worry: Not on file  . Food insecurity - inability: Not on file  . Transportation needs - medical: Not on file  . Transportation needs - non-medical: Not on file  Occupational History  . Not on file  Tobacco Use  . Smoking status: Never Smoker  . Smokeless tobacco: Never Used  Substance and Sexual Activity  . Alcohol use: Yes    Alcohol/week: 0.0 oz    Comment: social  . Drug use: No  . Sexual activity: Not Currently  Other Topics Concern  . Not on file  Social History Narrative  . Not on file     Current Outpatient Medications:  .  Cholecalciferol (VITAMIN D) 2000 UNITS tablet, Take 1 tablet by mouth daily., Disp: , Rfl:  .  fluticasone (FLONASE) 50 MCG/ACT nasal spray, Place 2 sprays into both nostrils daily., Disp: 16 g, Rfl: 2 .  loratadine (CLARITIN) 10 MG tablet, Take 1 tablet (10 mg total) by mouth daily., Disp: 100 tablet, Rfl: 2 .  montelukast (SINGULAIR) 10 MG tablet, Take 1 tablet (10 mg total) by mouth daily., Disp: 90 tablet, Rfl: 1 .  Multiple Vitamin tablet, Take 1 tablet by mouth daily., Disp: , Rfl:  .  olopatadine (PATANOL) 0.1 % ophthalmic solution, Place 1 drop into both eyes 2 (two) times daily., Disp: 5 mL, Rfl: 2 .  Semaglutide (OZEMPIC) 1 MG/DOSE SOPN,  Inject 1 mg into the skin once a week., Disp: 2 pen, Rfl: 5 .  triamterene-hydrochlorothiazide (MAXZIDE-25) 37.5-25 MG tablet, TAKE ONE TABLET BY MOUTH ONCE DAILY, Disp: 90 tablet, Rfl: 1  No Known Allergies   ROS  Constitutional: Negative for fever or weight change.  Respiratory: Negative for cough and shortness of breath.   Cardiovascular: Negative for chest pain or palpitations.  Gastrointestinal: Negative for abdominal pain, no bowel changes.  Musculoskeletal: Negative for gait problem or joint swelling.  Skin: Negative for rash.  Neurological: Negative for dizziness or headache.  No other specific complaints in a complete review of systems (except as listed in HPI  above).  Objective  Vitals:   06/18/17 0820  BP: 116/86  Pulse: 89  Resp: 12  SpO2: 98%  Weight: 204 lb 8 oz (92.8 kg)  Height: 5\' 1"  (1.549 m)    Body mass index is 38.64 kg/m.  Physical Exam  Constitutional: Patient appears well-developed and well-nourished. Obese  No distress.  HEENT: head atraumatic, normocephalic, pupils equal and reactive to light,  neck supple, throat within normal limits Cardiovascular: Normal rate, regular rhythm and normal heart sounds.  No murmur heard. No BLE edema. Pulmonary/Chest: Effort normal and breath sounds normal. No respiratory distress. Abdominal: Soft.  There is no tenderness. Psychiatric: Patient has a normal mood and affect. behavior is normal. Judgment and thought content normal.  PHQ2/9: [facility] screen [facility] 2/9 03/11/2017 11/26/2016 09/16/2016 06/18/2016 12/21/2015  Decreased Interest 0 0 0 0 0  Down, Depressed, Hopeless 0 0 0 0 0  PHQ - 2 Score 0 0 0 0 0     Fall Risk: Fall Risk  06/18/2017 03/11/2017 11/26/2016 09/16/2016 06/18/2016  Falls in the past year? No Yes No No No  Number falls in past yr: - 1 - - -  Injury with Fall? - Yes - - -  Comment - bruise on leg - - -  Risk for fall due to : - Other (Comment) - - -  Risk for fall due to: Comment - pt fell through a deck - - -  Follow up - Falls evaluation completed - - -     Functional Status Survey: Is the patient deaf or have difficulty hearing?: No Does the patient have difficulty seeing, even when wearing glasses/contacts?: No Does the patient have difficulty concentrating, remembering, or making decisions?: No Does the patient have difficulty walking or climbing stairs?: No Does the patient have difficulty dressing or bathing?: No Does the patient have difficulty doing errands alone such as visiting a doctor's office or shopping?: No   Assessment & Plan  1. Essential (primary) hypertension  bp is at goal  2. Dysmetabolic syndrome  - Semaglutide (OZEMPIC) 1 MG/DOSE  SOPN; Inject 1 mg into the skin once a week.  Dispense: 2 pen; Refill: 5  3. Class 2 obesity due to excess calories with body mass index (BMI) of 39.0 to 39.9 in adult, unspecified whether serious comorbidity present  She is stable  4. Perennial allergic rhinitis  - montelukast (SINGULAIR) 10 MG tablet; Take 1 tablet (10 mg total) by mouth daily.  Dispense: 90 tablet; Refill: 1  5. Vitamin D deficiency  Discussed natural sources of vitamin D

## 2017-09-17 ENCOUNTER — Encounter: Payer: Self-pay | Admitting: Family Medicine

## 2017-09-17 ENCOUNTER — Ambulatory Visit (INDEPENDENT_AMBULATORY_CARE_PROVIDER_SITE_OTHER): Payer: BC Managed Care – PPO | Admitting: Family Medicine

## 2017-09-17 VITALS — BP 112/76 | HR 92 | Temp 98.8°F | Resp 16 | Ht 61.0 in | Wt 207.8 lb

## 2017-09-17 DIAGNOSIS — Z01419 Encounter for gynecological examination (general) (routine) without abnormal findings: Secondary | ICD-10-CM | POA: Diagnosis not present

## 2017-09-17 NOTE — Patient Instructions (Addendum)
Calorie Counting for Weight Loss Calories are units of energy. Your body needs a certain amount of calories from food to keep you going throughout the day. When you eat more calories than your body needs, your body stores the extra calories as fat. When you eat fewer calories than your body needs, your body burns fat to get the energy it needs. Calorie counting means keeping track of how many calories you eat and drink each day. Calorie counting can be helpful if you need to lose weight. If you make sure to eat fewer calories than your body needs, you should lose weight. Ask your health care provider what a healthy weight is for you. For calorie counting to work, you will need to eat the right number of calories in a day in order to lose a healthy amount of weight per week. A dietitian can help you determine how many calories you need in a day and will give you suggestions on how to reach your calorie goal.  A healthy amount of weight to lose per week is usually 1-2 lb (0.5-0.9 kg). This usually means that your daily calorie intake should be reduced by 500-750 calories.  Eating 1,200 - 1,500 calories per day can help most women lose weight.  Eating 1,500 - 1,800 calories per day can help most men lose weight.  What is my plan? My goal is to have __________ calories per day. If I have this many calories per day, I should lose around __________ pounds per week. What do I need to know about calorie counting? In order to meet your daily calorie goal, you will need to:  Find out how many calories are in each food you would like to eat. Try to do this before you eat.  Decide how much of the food you plan to eat.  Write down what you ate and how many calories it had. Doing this is called keeping a food log.  To successfully lose weight, it is important to balance calorie counting with a healthy lifestyle that includes regular activity. Aim for 150 minutes of moderate exercise (such as walking) or 75  minutes of vigorous exercise (such as running) each week. Where do I find calorie information?  The number of calories in a food can be found on a Nutrition Facts label. If a food does not have a Nutrition Facts label, try to look up the calories online or ask your dietitian for help. Remember that calories are listed per serving. If you choose to have more than one serving of a food, you will have to multiply the calories per serving by the amount of servings you plan to eat. For example, the label on a package of bread might say that a serving size is 1 slice and that there are 90 calories in a serving. If you eat 1 slice, you will have eaten 90 calories. If you eat 2 slices, you will have eaten 180 calories. How do I keep a food log? Immediately after each meal, record the following information in your food log:  What you ate. Don't forget to include toppings, sauces, and other extras on the food.  How much you ate. This can be measured in cups, ounces, or number of items.  How many calories each food and drink had.  The total number of calories in the meal.  Keep your food log near you, such as in a small notebook in your pocket, or use a mobile app or website. Some  programs will calculate calories for you and show you how many calories you have left for the day to meet your goal. What are some calorie counting tips?  Use your calories on foods and drinks that will fill you up and not leave you hungry: ? Some examples of foods that fill you up are nuts and nut butters, vegetables, lean proteins, and high-fiber foods like whole grains. High-fiber foods are foods with more than 5 g fiber per serving. ? Drinks such as sodas, specialty coffee drinks, alcohol, and juices have a lot of calories, yet do not fill you up.  Eat nutritious foods and avoid empty calories. Empty calories are calories you get from foods or beverages that do not have many vitamins or protein, such as candy, sweets, and  soda. It is better to have a nutritious high-calorie food (such as an avocado) than a food with few nutrients (such as a bag of chips).  Know how many calories are in the foods you eat most often. This will help you calculate calorie counts faster.  Pay attention to calories in drinks. Low-calorie drinks include water and unsweetened drinks.  Pay attention to nutrition labels for "low fat" or "fat free" foods. These foods sometimes have the same amount of calories or more calories than the full fat versions. They also often have added sugar, starch, or salt, to make up for flavor that was removed with the fat.  Find a way of tracking calories that works for you. Get creative. Try different apps or programs if writing down calories does not work for you. What are some portion control tips?  Know how many calories are in a serving. This will help you know how many servings of a certain food you can have.  Use a measuring cup to measure serving sizes. You could also try weighing out portions on a kitchen scale. With time, you will be able to estimate serving sizes for some foods.  Take some time to put servings of different foods on your favorite plates, bowls, and cups so you know what a serving looks like.  Try not to eat straight from a bag or box. Doing this can lead to overeating. Put the amount you would like to eat in a cup or on a plate to make sure you are eating the right portion.  Use smaller plates, glasses, and bowls to prevent overeating.  Try not to multitask (for example, watch TV or use your computer) while eating. If it is time to eat, sit down at a table and enjoy your food. This will help you to know when you are full. It will also help you to be aware of what you are eating and how much you are eating. What are tips for following this plan? Reading food labels  Check the calorie count compared to the serving size. The serving size may be smaller than what you are used to  eating.  Check the source of the calories. Make sure the food you are eating is high in vitamins and protein and low in saturated and trans fats. Shopping  Read nutrition labels while you shop. This will help you make healthy decisions before you decide to purchase your food.  Make a grocery list and stick to it. Cooking  Try to cook your favorite foods in a healthier way. For example, try baking instead of frying.  Use low-fat dairy products. Meal planning  Use more fruits and vegetables. Half of your plate should   be fruits and vegetables.  Include lean proteins like poultry and fish. How do I count calories when eating out?  Ask for smaller portion sizes.  Consider sharing an entree and sides instead of getting your own entree.  If you get your own entree, eat only half. Ask for a box at the beginning of your meal and put the rest of your entree in it so you are not tempted to eat it.  If calories are listed on the menu, choose the lower calorie options.  Choose dishes that include vegetables, fruits, whole grains, low-fat dairy products, and lean protein.  Choose items that are boiled, broiled, grilled, or steamed. Stay away from items that are buttered, battered, fried, or served with cream sauce. Items labeled "crispy" are usually fried, unless stated otherwise.  Choose water, low-fat milk, unsweetened iced tea, or other drinks without added sugar. If you want an alcoholic beverage, choose a lower calorie option such as a glass of wine or light beer.  Ask for dressings, sauces, and syrups on the side. These are usually high in calories, so you should limit the amount you eat.  If you want a salad, choose a garden salad and ask for grilled meats. Avoid extra toppings like bacon, cheese, or fried items. Ask for the dressing on the side, or ask for olive oil and vinegar or lemon to use as dressing.  Estimate how many servings of a food you are given. For example, a serving of  cooked rice is  cup or about the size of half a baseball. Knowing serving sizes will help you be aware of how much food you are eating at restaurants. The list below tells you how big or small some common portion sizes are based on everyday objects: ? 1 oz-4 stacked dice. ? 3 oz-1 deck of cards. ? 1 tsp-1 die. ? 1 Tbsp- a ping-pong ball. ? 2 Tbsp-1 ping-pong ball. ?  cup- baseball. ? 1 cup-1 baseball. Summary  Calorie counting means keeping track of how many calories you eat and drink each day. If you eat fewer calories than your body needs, you should lose weight.  A healthy amount of weight to lose per week is usually 1-2 lb (0.5-0.9 kg). This usually means reducing your daily calorie intake by 500-750 calories.  The number of calories in a food can be found on a Nutrition Facts label. If a food does not have a Nutrition Facts label, try to look up the calories online or ask your dietitian for help.  Use your calories on foods and drinks that will fill you up, and not on foods and drinks that will leave you hungry.  Use smaller plates, glasses, and bowls to prevent overeating. This information is not intended to replace advice given to you by your health care provider. Make sure you discuss any questions you have with your health care provider. Document Released: 07/01/2005 Document Revised: 05/31/2016 Document Reviewed: 05/31/2016 Elsevier Interactive Patient Education  2018 [place] 18-39 Years, Female Preventive care refers to lifestyle choices and visits with your health care provider that can promote health and wellness. What does preventive care include?  A yearly physical exam. This is also called an annual well check.  Dental exams once or twice a year.  Routine eye exams. Ask your health care provider how often you should have your eyes checked.  Personal lifestyle choices, including: ? Daily care of your teeth and gums. ? Regular physical  activity. ?  Eating a healthy diet. ? Avoiding tobacco and drug use. ? Limiting alcohol use. ? Practicing safe sex. ? Taking vitamin and mineral supplements as recommended by your health care provider. What happens during an annual well check? The services and screenings done by your health care provider during your annual well check will depend on your age, overall health, lifestyle risk factors, and family history of disease. Counseling Your health care provider may ask you questions about your:  Alcohol use.  Tobacco use.  Drug use.  Emotional well-being.  Home and relationship well-being.  Sexual activity.  Eating habits.  Work and work Statistician.  Method of birth control.  Menstrual cycle.  Pregnancy history.  Screening You may have the following tests or measurements:  Height, weight, and BMI.  Diabetes screening. This is done by checking your blood sugar (glucose) after you have not eaten for a while (fasting).  Blood pressure.  Lipid and cholesterol levels. These may be checked every 5 years starting at age 10.  Skin check.  Hepatitis C blood test.  Hepatitis B blood test.  Sexually transmitted disease (STD) testing.  BRCA-related cancer screening. This may be done if you have a family history of breast, ovarian, tubal, or peritoneal cancers.  Pelvic exam and Pap test. This may be done every 3 years starting at age 61. Starting at age 71, this may be done every 5 years if you have a Pap test in combination with an HPV test.  Discuss your test results, treatment options, and if necessary, the need for more tests with your health care provider. Vaccines Your health care provider may recommend certain vaccines, such as:  Influenza vaccine. This is recommended every year.  Tetanus, diphtheria, and acellular pertussis (Tdap, Td) vaccine. You may need a Td booster every 10 years.  Varicella vaccine. You may need this if you have not been  vaccinated.  HPV vaccine. If you are 47 or younger, you may need three doses over 6 months.  Measles, mumps, and rubella (MMR) vaccine. You may need at least one dose of MMR. You may also need a second dose.  Pneumococcal 13-valent conjugate (PCV13) vaccine. You may need this if you have certain conditions and were not previously vaccinated.  Pneumococcal polysaccharide (PPSV23) vaccine. You may need one or two doses if you smoke cigarettes or if you have certain conditions.  Meningococcal vaccine. One dose is recommended if you are age 57-21 years and a first-year college student living in a residence hall, or if you have one of several medical conditions. You may also need additional booster doses.  Hepatitis A vaccine. You may need this if you have certain conditions or if you travel or work in places where you may be exposed to hepatitis A.  Hepatitis B vaccine. You may need this if you have certain conditions or if you travel or work in places where you may be exposed to hepatitis B.  Haemophilus influenzae type b (Hib) vaccine. You may need this if you have certain risk factors.  Talk to your health care provider about which screenings and vaccines you need and how often you need them. This information is not intended to replace advice given to you by your health care provider. Make sure you discuss any questions you have with your health care provider. Document Released: 08/27/2001 Document Revised: 03/20/2016 Document Reviewed: 05/02/2015 Elsevier Interactive Patient Education  [employer].

## 2017-09-17 NOTE — Progress Notes (Signed)
Name: Kaitlin Schroeder   MRN: [id number]    DOB: [birth year]   Date:09/17/2017       Progress Note  Subjective  Chief Complaint  Chief Complaint  Patient presents with  . Annual Exam    HPI   Patient presents for annual CPE.  Diet: discussed importance of high calcium diet, lots of fish, fruit and vegetables  USPSTF grade A and B recommendations  Depression:  Depression screen [facility] 2/9 09/17/2017 03/11/2017 11/26/2016 09/16/2016 06/18/2016  Decreased Interest 0 0 0 0 0  Down, Depressed, Hopeless 0 0 0 0 0  PHQ - 2 Score 0 0 0 0 0   Hypertension: BP Readings from Last 3 Encounters:  09/17/17 112/76  06/18/17 116/86  03/11/17 123/68   Obesity: Wt Readings from Last 3 Encounters:  09/17/17 207 lb 12.8 oz (94.3 kg)  06/18/17 204 lb 8 oz (92.8 kg)  03/11/17 205 lb 1.6 oz (93 kg)   BMI Readings from Last 3 Encounters:  09/17/17 39.26 kg/m  06/18/17 38.64 kg/m  03/11/17 38.75 kg/m     HIV, hep B, hep C: done in the past and not high risk  STD testing and prevention (chl/gon/syphilis): N/A Intimate partner violence: negative screen  Sexual History/Pain during Intercourse: never sexually active Menstrual History/LMP/Abnormal Bleeding: lasts 4 days, heavy first 2 days, not much cramping, LMP:09/11/2017 Incontinence Symptoms: none   Advanced Care Planning: A voluntary discussion about advance care planning including the explanation and discussion of advance directives.  Discussed health care proxy and Living will, and the patient was able to identify a health care proxy as mother   Patient does not have a living will at present time  Breast cancer: start at age 53 BRCA gene screening: discussed but not indicated  Cervical cancer screening: is up to date   Lipids:  Lab Results  Component Value Date   CHOL 143 09/16/2016   CHOL 167 12/21/2015   CHOL 135 01/05/2015   Lab Results  Component Value Date   HDL 41 (L) 09/16/2016   HDL 44 12/21/2015   HDL 38 01/05/2015   Lab  Results  Component Value Date   LDLCALC 80 09/16/2016   LDLCALC 103 (H) 12/21/2015   LDLCALC 87 01/05/2015   Lab Results  Component Value Date   TRIG 109 09/16/2016   TRIG 100 12/21/2015   TRIG 49 01/05/2015   Lab Results  Component Value Date   CHOLHDL 3.5 09/16/2016   CHOLHDL 3.8 12/21/2015   No results found for: LDLDIRECT  Glucose:  Glucose  Date Value Ref Range Status  12/21/2015 73 65 - 99 mg/dL Final   Glucose, Bld  Date Value Ref Range Status  09/16/2016 80 65 - 99 mg/dL Final     Patient Active Problem List   Diagnosis Date Noted  . Acanthosis nigricans 12/31/2014  . Essential (primary) hypertension 12/31/2014  . Dysmetabolic syndrome 42/70/6237  . Obesity 12/31/2014  . Perennial allergic rhinitis 12/31/2014  . Vitamin D deficiency 12/31/2014    Past Surgical History:  Procedure Laterality Date  . MOUTH SURGERY      Family History  Problem Relation Age of Onset  . Hypertension Mother   . Hypertension Father     Social History   Socioeconomic History  . Marital status: Single    Spouse name: Not on file  . Number of children: 0  . Years of education: Not on file  . Highest education level: Associate degree: academic program  Social Needs  .  Financial resource strain: Not very hard  . Food insecurity - worry: Never true  . Food insecurity - inability: Never true  . Transportation needs - medical: No  . Transportation needs - non-medical: No  Occupational History  . Occupation: Web designer   Tobacco Use  . Smoking status: Never Smoker  . Smokeless tobacco: Never Used  Substance and Sexual Activity  . Alcohol use: Yes    Alcohol/week: 0.0 oz    Comment: social - a few times a year  . Drug use: No  . Sexual activity: No    Birth control/protection: Abstinence  Other Topics Concern  . Not on file  Social History Narrative   Lives with mother, father ( works in [place] and two sisters.     Current Outpatient Medications:   .  Cholecalciferol (VITAMIN D) 2000 UNITS tablet, Take 1 tablet by mouth daily., Disp: , Rfl:  .  fluticasone (FLONASE) 50 MCG/ACT nasal spray, Place 2 sprays into both nostrils daily., Disp: 16 g, Rfl: 2 .  loratadine (CLARITIN) 10 MG tablet, Take 1 tablet (10 mg total) by mouth daily., Disp: 100 tablet, Rfl: 2 .  montelukast (SINGULAIR) 10 MG tablet, Take 1 tablet (10 mg total) by mouth daily., Disp: 90 tablet, Rfl: 1 .  Multiple Vitamin tablet, Take 1 tablet by mouth daily., Disp: , Rfl:  .  olopatadine (PATANOL) 0.1 % ophthalmic solution, Place 1 drop into both eyes 2 (two) times daily. (Patient taking differently: Place 1 drop into both eyes as needed. ), Disp: 5 mL, Rfl: 2 .  Semaglutide (OZEMPIC) 1 MG/DOSE SOPN, Inject 1 mg into the skin once a week., Disp: 2 pen, Rfl: 5 .  triamterene-hydrochlorothiazide (MAXZIDE-25) 37.5-25 MG tablet, TAKE ONE TABLET BY MOUTH ONCE DAILY, Disp: 90 tablet, Rfl: 1  No Known Allergies   ROS   Constitutional: Negative for fever or weight change.  Respiratory: Negative for cough and shortness of breath.   Cardiovascular: Negative for chest pain or palpitations.  Gastrointestinal: Negative for abdominal pain, no bowel changes.  Musculoskeletal: Negative for gait problem or joint swelling.  Skin: Negative for rash.  Neurological: Negative for dizziness or headache.  No other specific complaints in a complete review of systems (except as listed in HPI above).   Objective  Vitals:   09/17/17 0838  BP: 112/76  Pulse: 92  Resp: 16  Temp: 98.8 F (37.1 C)  TempSrc: Oral  SpO2: 97%  Weight: 207 lb 12.8 oz (94.3 kg)  Height: '5\' 1"'$  (1.549 m)    Body mass index is 39.26 kg/m.  Physical Exam  Constitutional: Patient appears well-developed and well-nourished. Obese. No distress.  HENT: Head: Normocephalic and atraumatic. Ears: B TMs ok, no erythema or effusion; Nose: Nose normal. Mouth/Throat: Oropharynx is clear and moist. No oropharyngeal  exudate.  Eyes: Conjunctivae and EOM are normal. Pupils are equal, round, and reactive to light. No scleral icterus.  Neck: Normal range of motion. Neck supple. No JVD present. No thyromegaly present.  Cardiovascular: Normal rate, regular rhythm and normal heart sounds.  No murmur heard. No BLE edema. Pulmonary/Chest: Effort normal and breath sounds normal. No respiratory distress. Abdominal: Soft. Bowel sounds are normal, no distension. There is no tenderness. no masses Breast: no lumps or masses, no nipple discharge or rashes FEMALE GENITALIA:  Not done  RECTAL: not done  Musculoskeletal: Normal range of motion, no joint effusions. No gross deformities Neurological: he is alert and oriented to person, place, and time. No cranial nerve  deficit. Coordination, balance, strength, speech and gait are normal.  Skin: Skin is warm and dry. No rash noted. No erythema.  Psychiatric: Patient has a normal mood and affect. behavior is normal. Judgment and thought content normal.   PHQ2/9: Depression screen [facility] 2/9 09/17/2017 03/11/2017 11/26/2016 09/16/2016 06/18/2016  Decreased Interest 0 0 0 0 0  Down, Depressed, Hopeless 0 0 0 0 0  PHQ - 2 Score 0 0 0 0 0    Fall Risk: Fall Risk  09/17/2017 06/18/2017 03/11/2017 11/26/2016 09/16/2016  Falls in the past year? No No Yes No No  Number falls in past yr: - - 1 - -  Injury with Fall? - - Yes - -  Comment - - bruise on leg - -  Risk for fall due to : - - Other (Comment) - -  Risk for fall due to: Comment - - pt fell through a deck - -  Follow up - - Falls evaluation completed - -     Functional Status Survey: Is the patient deaf or have difficulty hearing?: No Does the patient have difficulty seeing, even when wearing glasses/contacts?: No Does the patient have difficulty concentrating, remembering, or making decisions?: No Does the patient have difficulty walking or climbing stairs?: No Does the patient have difficulty dressing or bathing?: No Does the  patient have difficulty doing errands alone such as visiting a doctor's office or shopping?: No   Assessment & Plan  1. Well woman exam  Discussed importance of 150 minutes of physical activity weekly, eat two servings of fish weekly, eat one serving of tree nuts ( cashews, pistachios, pecans, almonds.Marland Kitchen) every other day, eat 6 servings of fruit/vegetables daily and drink plenty of water and avoid sweet beverages.  - CBC with Differential/Platelet - COMPLETE METABOLIC PANEL WITH GFR - Hemoglobin A1c - Lipid panel

## 2017-09-18 LAB — CBC WITH DIFFERENTIAL/PLATELET
Basophils Absolute: 39 cells/uL (ref 0–200)
Basophils Relative: 0.6 %
EOS PCT: 2.5 %
Eosinophils Absolute: 163 cells/uL (ref 15–500)
HEMATOCRIT: 42 % (ref 35.0–45.0)
HEMOGLOBIN: 14.5 g/dL (ref 11.7–15.5)
LYMPHS ABS: 2574 {cells}/uL (ref 850–3900)
MCH: 31.8 pg (ref 27.0–33.0)
MCHC: 34.5 g/dL (ref 32.0–36.0)
MCV: 92.1 fL (ref 80.0–100.0)
MPV: 9.8 fL (ref 7.5–12.5)
Monocytes Relative: 5.5 %
NEUTROS ABS: 3367 {cells}/uL (ref 1500–7800)
Neutrophils Relative %: 51.8 %
Platelets: 375 10*3/uL (ref 140–400)
RBC: 4.56 10*6/uL (ref 3.80–5.10)
RDW: 12 % (ref 11.0–15.0)
Total Lymphocyte: 39.6 %
WBC mixed population: 358 cells/uL (ref 200–950)
WBC: 6.5 10*3/uL (ref 3.8–10.8)

## 2017-09-18 LAB — COMPLETE METABOLIC PANEL WITH GFR
AG Ratio: 1.5 (calc) (ref 1.0–2.5)
ALBUMIN MSPROF: 4.2 g/dL (ref 3.6–5.1)
ALT: 23 U/L (ref 6–29)
AST: 19 U/L (ref 10–30)
Alkaline phosphatase (APISO): 46 U/L (ref 33–115)
BUN: 12 mg/dL (ref 7–25)
CALCIUM: 9.3 mg/dL (ref 8.6–10.2)
CO2: 29 mmol/L (ref 20–32)
CREATININE: 0.98 mg/dL (ref 0.50–1.10)
Chloride: 105 mmol/L (ref 98–110)
GFR, Est African American: 85 mL/min/{1.73_m2} (ref >=60)
GFR, Est Non African American: 74 mL/min/{1.73_m2} (ref >=60)
GLOBULIN: 2.8 g/dL (ref 1.9–3.7)
Glucose, Bld: 88 mg/dL (ref 65–99)
Potassium: 3.8 mmol/L (ref 3.5–5.3)
SODIUM: 140 mmol/L (ref 135–146)
Total Bilirubin: 0.3 mg/dL (ref 0.2–1.2)
Total Protein: 7 g/dL (ref 6.1–8.1)

## 2017-09-18 LAB — LIPID PANEL
CHOL/HDL RATIO: 3.9 (calc) (ref <5.0)
Cholesterol: 165 mg/dL (ref <200)
HDL: 42 mg/dL — AB (ref >50)
LDL Cholesterol (Calc): 103 mg/dL (calc) — ABNORMAL HIGH
NON-HDL CHOLESTEROL (CALC): 123 mg/dL (ref <130)
Triglycerides: 104 mg/dL (ref <150)

## 2017-09-18 LAB — HEMOGLOBIN A1C
HEMOGLOBIN A1C: 5 %{Hb} (ref <5.7)
MEAN PLASMA GLUCOSE: 97 (calc)
eAG (mmol/L): 5.4 (calc)

## 2017-12-02 ENCOUNTER — Other Ambulatory Visit: Payer: Self-pay | Admitting: Family Medicine

## 2017-12-02 DIAGNOSIS — E8881 Metabolic syndrome: Secondary | ICD-10-CM

## 2017-12-18 ENCOUNTER — Ambulatory Visit: Payer: BC Managed Care – PPO | Admitting: Family Medicine

## 2017-12-30 ENCOUNTER — Other Ambulatory Visit: Payer: Self-pay | Admitting: Family Medicine

## 2017-12-30 DIAGNOSIS — E8881 Metabolic syndrome: Secondary | ICD-10-CM

## 2017-12-30 NOTE — Telephone Encounter (Signed)
Refill request for general medication. Ozempic  Last office visit 09/17/17   Follow up on 01/05/18

## 2018-01-05 ENCOUNTER — Encounter: Payer: Self-pay | Admitting: Family Medicine

## 2018-01-05 ENCOUNTER — Ambulatory Visit: Payer: BC Managed Care – PPO | Admitting: Family Medicine

## 2018-01-05 VITALS — BP 120/88 | HR 102 | Resp 16 | Ht 61.0 in | Wt 208.1 lb

## 2018-01-05 DIAGNOSIS — Z23 Encounter for immunization: Secondary | ICD-10-CM | POA: Diagnosis not present

## 2018-01-05 DIAGNOSIS — I10 Essential (primary) hypertension: Secondary | ICD-10-CM

## 2018-01-05 DIAGNOSIS — J3089 Other allergic rhinitis: Secondary | ICD-10-CM

## 2018-01-05 DIAGNOSIS — E8881 Metabolic syndrome: Secondary | ICD-10-CM | POA: Diagnosis not present

## 2018-01-05 MED ORDER — SEMAGLUTIDE (1 MG/DOSE) 2 MG/1.5ML ~~LOC~~ SOPN: 1.0000 mg | PEN_INJECTOR | SUBCUTANEOUS | 2 refills | Status: DC

## 2018-01-05 MED ORDER — MONTELUKAST SODIUM 10 MG PO TABS: 10.0000 mg | ORAL_TABLET | Freq: Every day | ORAL | 1 refills | Status: DC

## 2018-01-05 MED ORDER — TRIAMTERENE-HCTZ 37.5-25 MG PO TABS: 0.5000 | ORAL_TABLET | Freq: Every day | ORAL | 0 refills | Status: DC

## 2018-01-05 NOTE — Progress Notes (Signed)
Name: Kaitlin Schroeder   MRN: [id number]    DOB: Jun 24, 1981   Date:01/05/2018       Progress Note  Subjective  Chief Complaint  Chief Complaint  Patient presents with  . Obesity  . Hypertension  . Immunizations    Does she need MMR.    HPI  HTN: patient is taking medication and denies side effects, no chest pain, palpitation or SOB.Taking half pill of Maxzide and bp has been at goal. Unchanged .  Perennial Allergic Rhinitis: taking Loratadine and singulair daily and nasal spray prn. She has some nasal congestion and runny eye almost every morning, no itching. She has some ear fullness at this time, and will resume Flonase  Adult Obesity: insurance stopped paying for Qsymia, her original weight was 218 lbs on 04/2013. She was down to 199 lbs in December 2016but medication had to be switched to [facility] around Feb 2017 after a gap on medication and she is up to 205.6lbsInsurance stopped paying for Contrave because she was not losing weight. She was given Victoza Dec 2017 weight of 21 lbs, did not lose much weight, started on Ozempic 09/2016 at 213 lbs and has been stable at 205 range since 02/2017 , she still eats healthy, exercises on a regular basis and feels well. Over the past two visit it has been at 208 lbs. Discussed resuming other weight loss medications, but she states they are still not paying for Qsymia.    Metabolic Syndrome: NFAO1H was5.0% on her last visit. She denies polyphagia, polydipsia or polyuria. She is on Ozempic. States noticed acanthosis nigricans to be improving.    Patient Active Problem List   Diagnosis Date Noted  . Acanthosis nigricans 12/31/2014  . Essential (primary) hypertension 12/31/2014  . Dysmetabolic syndrome 08/65/7846  . Obesity 12/31/2014  . Perennial allergic rhinitis 12/31/2014  . Vitamin D deficiency 12/31/2014    Past Surgical History:  Procedure Laterality Date  . MOUTH SURGERY      Family History  Problem Relation Age of Onset   . Hypertension Mother   . Hypertension Father     Social History   Socioeconomic History  . Marital status: Single    Spouse name: Not on file  . Number of children: 0  . Years of education: Not on file  . Highest education level: Associate degree: academic program  Occupational History  . Occupation: Web designer   Social Needs  . Financial resource strain: Not very hard  . Food insecurity:    Worry: Never true    Inability: Never true  . Transportation needs:    Medical: No    Non-medical: No  Tobacco Use  . Smoking status: Never Smoker  . Smokeless tobacco: Never Used  Substance and Sexual Activity  . Alcohol use: Yes    Alcohol/week: 0.0 oz    Comment: social - a few times a year  . Drug use: No  . Sexual activity: Never    Birth control/protection: Abstinence  Lifestyle  . Physical activity:    Days per week: 3 days    Minutes per session: 30 min  . Stress: Not at all  Relationships  . Social connections:    Talks on phone: Once a week    Gets together: Never    Attends religious service: More than 4 times per year    Active member of club or organization: No    Attends meetings of clubs or organizations: Never    Relationship status: Never married  .  Intimate partner violence:    Fear of current or ex partner: No    Emotionally abused: No    Physically abused: No    Forced sexual activity: No  Other Topics Concern  . Not on file  Social History Narrative   Lives with mother, father ( [place] in [place] and two sisters.     Current Outpatient Medications:  .  Cholecalciferol (VITAMIN D) 2000 UNITS tablet, Take 1 tablet by mouth daily., Disp: , Rfl:  .  fluticasone (FLONASE) 50 MCG/ACT nasal spray, Place 2 sprays into both nostrils daily., Disp: 16 g, Rfl: 2 .  loratadine (CLARITIN) 10 MG tablet, Take 1 tablet (10 mg total) by mouth daily., Disp: 100 tablet, Rfl: 2 .  montelukast (SINGULAIR) 10 MG tablet, Take 1 tablet (10 mg total) by mouth  daily., Disp: 90 tablet, Rfl: 1 .  Multiple Vitamin tablet, Take 1 tablet by mouth daily., Disp: , Rfl:  .  olopatadine (PATANOL) 0.1 % ophthalmic solution, Place 1 drop into both eyes 2 (two) times daily. (Patient taking differently: Place 1 drop into both eyes as needed. ), Disp: 5 mL, Rfl: 2 .  Semaglutide (OZEMPIC) 1 MG/DOSE SOPN, Inject 1 mg into the skin once a week., Disp: 3 mL, Rfl: 2 .  triamterene-hydrochlorothiazide (MAXZIDE-25) 37.5-25 MG tablet, Take 0.5 tablets by mouth daily., Disp: 45 tablet, Rfl: 0  No Known Allergies   ROS  Constitutional: Negative for fever or weight change.  Respiratory: Negative for cough and shortness of breath.   Cardiovascular: Negative for chest pain or palpitations.  Gastrointestinal: Negative for abdominal pain, no bowel changes.  Musculoskeletal: Negative for gait problem or joint swelling.  Skin: Negative for rash.  Neurological: Negative for dizziness or headache.  No other specific complaints in a complete review of systems (except as listed in HPI above).  Objective  Vitals:   01/05/18 0741  BP: 120/88  Pulse: (!) 102  Resp: 16  SpO2: 97%  Weight: 208 lb 1.6 oz (94.4 kg)  Height: 5' 1" (1.549 m)    Body mass index is 39.32 kg/m.  Physical Exam  Constitutional: Patient appears well-developed and well-nourished. Obese  No distress.  HEENT: head atraumatic, normocephalic, pupils equal and reactive to light,neck supple, throat within normal limits Cardiovascular: Normal rate, regular rhythm and normal heart sounds.  No murmur heard. No BLE edema. Pulmonary/Chest: Effort normal and breath sounds normal. No respiratory distress. Abdominal: Soft.  There is no tenderness. Psychiatric: Patient has a normal mood and affect. behavior is normal. Judgment and thought content normal.  PHQ2/9: Depression screen [facility] 2/9 09/17/2017 03/11/2017 11/26/2016 09/16/2016 06/18/2016  Decreased Interest 0 0 0 0 0  Down, Depressed, Hopeless 0 0 0 0 0   PHQ - 2 Score 0 0 0 0 0     Fall Risk: Fall Risk  01/05/2018 09/17/2017 06/18/2017 03/11/2017 11/26/2016  Falls in the past year? No No No Yes No  Number falls in past yr: - - - 1 -  Injury with Fall? - - - Yes -  Comment - - - bruise on leg -  Risk for fall due to : - - - Other (Comment) -  Risk for fall due to: Comment - - - pt fell through a deck -  Follow up - - - Falls evaluation completed -     Assessment & Plan  1. Perennial allergic rhinitis  - montelukast (SINGULAIR) 10 MG tablet; Take 1 tablet (10 mg total) by mouth daily.  Dispense: 90 tablet; Refill: 1  2. Dysmetabolic syndrome  - Semaglutide (OZEMPIC) 1 MG/DOSE SOPN; Inject 1 mg into the skin once a week.  Dispense: 3 mL; Refill: 2  3. Essential (primary) hypertension  - triamterene-hydrochlorothiazide (MAXZIDE-25) 37.5-25 MG tablet; Take 0.5 tablets by mouth daily.  Dispense: 45 tablet; Refill: 0  4. Need for MMR vaccine  - MMR and varicella combined vaccine subcutaneous  Going on vacation in a few months to [place] and [place] and would like booster.  

## 2018-01-29 ENCOUNTER — Other Ambulatory Visit: Payer: Self-pay | Admitting: Family Medicine

## 2018-01-29 DIAGNOSIS — E8881 Metabolic syndrome: Secondary | ICD-10-CM

## 2018-01-29 NOTE — Telephone Encounter (Signed)
Refill request was sent to Dr. Krichna Sowles for approval and submission.  

## 2018-04-10 ENCOUNTER — Encounter: Payer: Self-pay | Admitting: Family Medicine

## 2018-04-10 ENCOUNTER — Ambulatory Visit: Payer: BC Managed Care – PPO | Admitting: Family Medicine

## 2018-04-10 VITALS — BP 114/78 | HR 71 | Temp 98.9°F | Resp 14 | Ht 61.0 in | Wt 207.7 lb

## 2018-04-10 DIAGNOSIS — I10 Essential (primary) hypertension: Secondary | ICD-10-CM

## 2018-04-10 DIAGNOSIS — E559 Vitamin D deficiency, unspecified: Secondary | ICD-10-CM | POA: Diagnosis not present

## 2018-04-10 DIAGNOSIS — Z6839 Body mass index (BMI) 39.0-39.9, adult: Secondary | ICD-10-CM

## 2018-04-10 DIAGNOSIS — J3089 Other allergic rhinitis: Secondary | ICD-10-CM

## 2018-04-10 DIAGNOSIS — E6609 Other obesity due to excess calories: Secondary | ICD-10-CM

## 2018-04-10 DIAGNOSIS — E8881 Metabolic syndrome: Secondary | ICD-10-CM

## 2018-04-10 NOTE — Progress Notes (Signed)
Name: Kaitlin Schroeder   MRN: [id number]    DOB: 1981/07/13   Date:04/10/2018       Progress Note  Subjective  Chief Complaint  Chief Complaint  Patient presents with  . Follow-up    3 mth f/u  . Hypertension  . Obesity  . Allergic Rhinitis   . Metabolic Syndrome  . Immunizations    will get it at work    HPI  HTN: patient is taking medication and denies side effects, no chest pain, edema, palpitation or SOB.Taking half pill of Maxzideand bp has been at goal.  Perennial Allergic Rhinitis: taking Loratadine and singulair daily and nasal spray prn. She has some nasal congestion. She was recently on vacation and exposed to a lot of cigarette smoke and pollution in [place]. She used some saline spray and used flonase and is back to normal today   Adult Obesity: insurance stopped paying for Qsymia, her original weight was 218 lbs on 04/2013. She was down to 199 lbs in December 2016but medication had to be switched to [facility] around Feb 2017 after a gap on medication and she is up to 205.6lbsInsurance stopped paying for Contrave because she was not losing weight. She was given Victoza Dec 2017 weight of 21 lbs, did not lose much weight, started on Ozempic 09/2016 at 213 lbs and has been stable at 205 range since 02/2017 , she still eats healthy, exercises on a regular basis and feels well. Over the past two visit it has been stable . It was 208 lbs times two, and now is 207.7 lbs.  Discussed resuming other weight loss medications, but she states they are still not paying for Qsymia. She will try to lose 3 lbs by next visit   Metabolic Syndrome: hgbA1C was5.0% on her last visit. She denies polyphagia, polydipsia or polyuria.She is on Ozempic. Denies side effects  Patient Active Problem List   Diagnosis Date Noted  . Acanthosis nigricans 12/31/2014  . Essential (primary) hypertension 12/31/2014  . Dysmetabolic syndrome 12/31/2014  . Obesity 12/31/2014  . Perennial allergic rhinitis  12/31/2014  . Vitamin D deficiency 12/31/2014    Past Surgical History:  Procedure Laterality Date  . MOUTH SURGERY      Family History  Problem Relation Age of Onset  . Hypertension Mother   . Hypertension Father     Social History   Socioeconomic History  . Marital status: Single    Spouse name: Not on file  . Number of children: 0  . Years of education: Not on file  . Highest education level: Associate degree: academic program  Occupational History  . Occupation: Environmental health practitioner   Social Needs  . Financial resource strain: Not very hard  . Food insecurity:    Worry: Never true    Inability: Never true  . Transportation needs:    Medical: No    Non-medical: No  Tobacco Use  . Smoking status: Never Smoker  . Smokeless tobacco: Never Used  Substance and Sexual Activity  . Alcohol use: Yes    Alcohol/week: 0.0 standard drinks    Comment: social - a few times a year  . Drug use: No  . Sexual activity: Never    Birth control/protection: Abstinence  Lifestyle  . Physical activity:    Days per week: 3 days    Minutes per session: 30 min  . Stress: Not at all  Relationships  . Social connections:    Talks on phone: Once a week  Gets together: Never    Attends religious service: More than 4 times per year    Active member of club or organization: No    Attends meetings of clubs or organizations: Never    Relationship status: Never married  . Intimate partner violence:    Fear of current or ex partner: No    Emotionally abused: No    Physically abused: No    Forced sexual activity: No  Other Topics Concern  . Not on file  Social History Narrative   Lives with mother, father ( [place] ) and two sisters.     Current Outpatient Medications:  .  Cholecalciferol (VITAMIN D) 2000 UNITS tablet, Take 1 tablet by mouth daily., Disp: , Rfl:  .  fluticasone (FLONASE) 50 MCG/ACT nasal spray, Place 2 sprays into both nostrils daily., Disp: 16 g, Rfl: 2 .   loratadine (CLARITIN) 10 MG tablet, Take 1 tablet (10 mg total) by mouth daily., Disp: 100 tablet, Rfl: 2 .  montelukast (SINGULAIR) 10 MG tablet, Take 1 tablet (10 mg total) by mouth daily., Disp: 90 tablet, Rfl: 1 .  Multiple Vitamin tablet, Take 1 tablet by mouth daily., Disp: , Rfl:  .  olopatadine (PATANOL) 0.1 % ophthalmic solution, Place 1 drop into both eyes 2 (two) times daily. (Patient taking differently: Place 1 drop into both eyes as needed. ), Disp: 5 mL, Rfl: 2 .  OZEMPIC 1 MG/DOSE SOPN, INJECT 1 MG INTO THE SKIN ONCE A WEEK., Disp: 9 mL, Rfl: 1 .  triamterene-hydrochlorothiazide (MAXZIDE-25) 37.5-25 MG tablet, Take 0.5 tablets by mouth daily., Disp: 45 tablet, Rfl: 0  No Known Allergies  I personally reviewed active problem list, medication list, allergies, family history, social history with the patient/caregiver today.   ROS  Constitutional: Negative for fever or weight change.  Respiratory: Negative for cough and shortness of breath.   Cardiovascular: Negative for chest pain or palpitations.  Gastrointestinal: Negative for abdominal pain, no bowel changes.  Musculoskeletal: Negative for gait problem or joint swelling.  Skin: Negative for rash.  Neurological: Negative for dizziness or headache.  No other specific complaints in a complete review of systems (except as listed in HPI above).  Objective  Vitals:   04/10/18 0745  BP: 114/78  Pulse: 71  Resp: 14  Temp: 98.9 F (37.2 C)  TempSrc: Oral  SpO2: 97%  Weight: 207 lb 11.2 oz (94.2 kg)  Height: 5\' 1"  (1.549 m)    Body mass index is 39.24 kg/m.  Physical Exam  Constitutional: Patient appears well-developed and well-nourished. Obese  No distress.  HEENT: head atraumatic, normocephalic, pupils equal and reactive to light,  neck supple, throat within normal limits Cardiovascular: Normal rate, regular rhythm and normal heart sounds.  No murmur heard. No BLE edema. Pulmonary/Chest: Effort normal and breath  sounds normal. No respiratory distress. Abdominal: Soft.  There is no tenderness. Psychiatric: Patient has a normal mood and affect. behavior is normal. Judgment and thought content normal.  PHQ2/9: Depression screen [facility] 2/9 04/10/2018 09/17/2017 03/11/2017 11/26/2016 09/16/2016  Decreased Interest 0 0 0 0 0  Down, Depressed, Hopeless 0 0 0 0 0  PHQ - 2 Score 0 0 0 0 0  Altered sleeping 0 - - - -  Tired, decreased energy 0 - - - -  Change in appetite 0 - - - -  Feeling bad or failure about yourself  0 - - - -  Trouble concentrating 0 - - - -  Moving slowly or fidgety/restless  0 - - - -  Suicidal thoughts 0 - - - -  PHQ-9 Score 0 - - - -  Difficult doing work/chores Not difficult at all - - - -     Fall Risk: Fall Risk  04/10/2018 01/05/2018 09/17/2017 06/18/2017 03/11/2017  Falls in the past year? No No No No Yes  Number falls in past yr: - - - - 1  Injury with Fall? - - - - Yes  Comment - - - - bruise on leg  Risk for fall due to : - - - - Other (Comment)  Risk for fall due to: Comment - - - - pt fell through a deck  Follow up - - - - Falls evaluation completed    Functional Status Survey: Is the patient deaf or have difficulty hearing?: No Does the patient have difficulty seeing, even when wearing glasses/contacts?: No Does the patient have difficulty concentrating, remembering, or making decisions?: No Does the patient have difficulty walking or climbing stairs?: No Does the patient have difficulty dressing or bathing?: No Does the patient have difficulty doing errands alone such as visiting a doctor's office or shopping?: No    Assessment & Plan  1. Essential (primary) hypertension  bp is at goal, continue medication, she will call back for refills.   2. Dysmetabolic syndrome  Doing well Ozempic   3. Class 2 obesity due to excess calories with body mass index (BMI) of 39.0 to 39.9 in adult, unspecified whether serious comorbidity present  Continue Ozempic   4. Vitamin D  deficiency  Continue supplementation   5. Perennial allergic rhinitis  Resume nasal spray

## 2018-05-01 ENCOUNTER — Encounter: Payer: Self-pay | Admitting: Family Medicine

## 2018-05-30 ENCOUNTER — Other Ambulatory Visit: Payer: Self-pay | Admitting: Family Medicine

## 2018-05-30 DIAGNOSIS — I10 Essential (primary) hypertension: Secondary | ICD-10-CM

## 2018-06-01 NOTE — Telephone Encounter (Signed)
Refill request for Hypertension medication:  Maxizide-25   Last office visit pertaining to hypertension: 04/10/2018  BP Readings from Last 3 Encounters:  04/10/18 114/78  01/05/18 120/88  09/17/17 112/76     Lab Results  Component Value Date   CREATININE 0.98 09/17/2017   BUN 12 09/17/2017   NA 140 09/17/2017   K 3.8 09/17/2017   CL 105 09/17/2017   CO2 29 09/17/2017     Follow-ups on file. 08/06/2018

## 2018-06-02 MED ORDER — TRIAMTERENE-HCTZ 37.5-25 MG PO TABS: 0.5000 | ORAL_TABLET | Freq: Every day | ORAL | 0 refills | Status: DC

## 2018-07-01 ENCOUNTER — Other Ambulatory Visit: Payer: Self-pay

## 2018-07-01 DIAGNOSIS — E8881 Metabolic syndrome: Secondary | ICD-10-CM

## 2018-07-01 MED ORDER — SEMAGLUTIDE (1 MG/DOSE) 2 MG/1.5ML ~~LOC~~ SOPN: 1.0000 mg | PEN_INJECTOR | SUBCUTANEOUS | 0 refills | Status: DC

## 2018-07-01 NOTE — Telephone Encounter (Signed)
Refill request for general medication: Ozempic 1 mg  Last office visit: 04/10/2018  Last physical exam: 09/17/2017  Follow-ups on file. 08/06/2018

## 2018-07-24 ENCOUNTER — Ambulatory Visit: Payer: BC Managed Care – PPO | Admitting: Family Medicine

## 2018-08-06 ENCOUNTER — Encounter: Payer: Self-pay | Admitting: Family Medicine

## 2018-08-06 ENCOUNTER — Ambulatory Visit: Payer: BC Managed Care – PPO | Admitting: Family Medicine

## 2018-08-06 VITALS — BP 130/90 | HR 95 | Temp 98.7°F | Resp 16 | Ht 61.0 in | Wt 211.5 lb

## 2018-08-06 DIAGNOSIS — E8881 Metabolic syndrome: Secondary | ICD-10-CM

## 2018-08-06 DIAGNOSIS — J3089 Other allergic rhinitis: Secondary | ICD-10-CM | POA: Diagnosis not present

## 2018-08-06 DIAGNOSIS — Z6839 Body mass index (BMI) 39.0-39.9, adult: Secondary | ICD-10-CM

## 2018-08-06 DIAGNOSIS — I10 Essential (primary) hypertension: Secondary | ICD-10-CM | POA: Diagnosis not present

## 2018-08-06 DIAGNOSIS — E6609 Other obesity due to excess calories: Secondary | ICD-10-CM | POA: Diagnosis not present

## 2018-08-06 MED ORDER — TRIAMTERENE-HCTZ 37.5-25 MG PO TABS: 0.5000 | ORAL_TABLET | Freq: Every day | ORAL | 0 refills | Status: DC

## 2018-08-06 MED ORDER — MONTELUKAST SODIUM 10 MG PO TABS: 10.0000 mg | ORAL_TABLET | Freq: Every day | ORAL | 1 refills | Status: DC

## 2018-08-06 MED ORDER — SEMAGLUTIDE (1 MG/DOSE) 2 MG/1.5ML ~~LOC~~ SOPN: 1.0000 mg | PEN_INJECTOR | SUBCUTANEOUS | 0 refills | Status: DC

## 2018-08-06 NOTE — Progress Notes (Signed)
Name: Kaitlin Schroeder   MRN: [id number]    DOB: [birth year]   Date:08/06/2018       Progress Note  Subjective  Chief Complaint  Chief Complaint  Patient presents with  . Hypertension    HPI  HTN: patient is taking medication and denies side effects, no chest pain, edema, palpitation or SOB.Taking half pill of Maxzideand bp at home has been at goal , but slightly elevated today when she arrive, advised to keep monitoring bp and return sooner if needed .  Perennial Allergic Rhinitis: takingLoratadineand singulair daily and nasal spray prn. She states some nasal congestion usually in am. She has noticed watery eyes, but no pruritis at this time   Adult Obesity: insurance stopped paying for Qsymia, her original weight was 218 lbs on 04/2013. She was down to 199 lbs in December 2016but [facility] a gap on medication and she is up to 205.6lbsInsurance stopped paying for Contrave because she was not losing weight. She was given Victoza Dec 2017 weight of 21 lbs, did not lose much weight, started on Ozempic 09/2016 at 213 lbs and has been stable at 205 range since 02/2017 , she still eats healthy, exercises on a regular basis and feels well. Over the past two visit it has been stable . It was 208 lbs times two, 207.7 lbs and now 211.5 lbs. She states she has not been physically active lately because of the cold also not enjoying the Wii exercises, she is thinking about joining a gym. Discussed OTF, spin bars, or even start couch to 5 k .   Metabolic Syndrome: hgbA1C was5.0% on her last visit.She denies polyphagia, polydipsia or polyuria.She is on Ozempic. Tolerating it well.    Patient Active Problem List   Diagnosis Date Noted  . Acanthosis nigricans 12/31/2014  . Essential (primary) hypertension 12/31/2014  . Dysmetabolic syndrome 12/31/2014  . Obesity 12/31/2014  . Perennial allergic rhinitis 12/31/2014  . Vitamin D deficiency  12/31/2014    Past Surgical History:  Procedure Laterality Date  . MOUTH SURGERY      Family History  Problem Relation Age of Onset  . Hypertension Mother   . Hypertension Father     Social History   Socioeconomic History  . Marital status: Single    Spouse name: Not on file  . Number of children: 0  . Years of education: Not on file  . Highest education level: Associate degree: academic program  Occupational History  . Occupation: Environmental health practitioneradministrative assistant   Social Needs  . Financial resource strain: Not very hard  . Food insecurity:    Worry: Never true    Inability: Never true  . Transportation needs:    Medical: No    Non-medical: No  Tobacco Use  . Smoking status: Never Smoker  . Smokeless tobacco: Never Used  Substance and Sexual Activity  . Alcohol use: Yes    Alcohol/week: 0.0 standard drinks    Comment: social - a few times a year  . Drug use: No  . Sexual activity: Never    Birth control/protection: Abstinence  Lifestyle  . Physical activity:    Days per week: 3 days    Minutes per session: 30 min  . Stress: Not at all  Relationships  . Social connections:    Talks on phone: Once a week    Gets together: Never    Attends religious service: More than 4 times per year  Active member of club or organization: No    Attends meetings of clubs or organizations: Never    Relationship status: Never married  . Intimate partner violence:    Fear of current or ex partner: No    Emotionally abused: No    Physically abused: No    Forced sexual activity: No  Other Topics Concern  . Not on file  Social History Narrative   Lives with mother, father ( [place] ) and two sisters.     Current Outpatient Medications:  .  Cholecalciferol (VITAMIN D) 2000 UNITS tablet, Take 1 tablet by mouth daily., Disp: , Rfl:  .  fluticasone (FLONASE) 50 MCG/ACT nasal spray, Place 2 sprays into both nostrils daily., Disp: 16 g, Rfl: 2 .  loratadine (CLARITIN) 10 MG tablet, Take  1 tablet (10 mg total) by mouth daily., Disp: 100 tablet, Rfl: 2 .  montelukast (SINGULAIR) 10 MG tablet, Take 1 tablet (10 mg total) by mouth daily., Disp: 90 tablet, Rfl: 1 .  Multiple Vitamin tablet, Take 1 tablet by mouth daily., Disp: , Rfl:  .  olopatadine (PATANOL) 0.1 % ophthalmic solution, Place 1 drop into both eyes 2 (two) times daily. (Patient taking differently: Place 1 drop into both eyes as needed. ), Disp: 5 mL, Rfl: 2 .  Semaglutide, 1 MG/DOSE, (OZEMPIC, 1 MG/DOSE,) 2 MG/1.5ML SOPN, Inject 1 mg into the skin once a week., Disp: 9 mL, Rfl: 0 .  triamterene-hydrochlorothiazide (MAXZIDE-25) 37.5-25 MG tablet, Take 0.5 tablets by mouth daily., Disp: 45 tablet, Rfl: 0  No Known Allergies  I personally reviewed active problem list, medication list, allergies, family history, social history, health maintenance with the patient/caregiver today.   ROS  Constitutional: Negative for fever or significant  weight change.  Respiratory: Negative for cough and shortness of breath.   Cardiovascular: Negative for chest pain or palpitations.  Gastrointestinal: Negative for abdominal pain, no bowel changes.  Musculoskeletal: Negative for gait problem or joint swelling.  Skin: Negative for rash.  Neurological: Negative for dizziness or headache.  No other specific complaints in a complete review of systems (except as listed in HPI above).  Objective  Vitals:   08/06/18 0754 08/06/18 0820  BP: 130/90 130/90  Pulse: 95   Resp: 16   Temp: 98.7 F (37.1 C)   TempSrc: Oral   SpO2: 98%   Weight: 211 lb 8 oz (95.9 kg)   Height: 5\' 1"  (1.549 m)     Body mass index is 39.96 kg/m.  Physical Exam  Constitutional: Patient appears well-developed and well-nourished. Obese No distress.  HEENT: head atraumatic, normocephalic, pupils equal and reactive to light,neck supple, throat within normal limits Cardiovascular: Normal rate, regular rhythm and normal heart sounds.  No murmur heard. No  BLE edema. Pulmonary/Chest: Effort normal and breath sounds normal. No respiratory distress. Abdominal: Soft.  There is no tenderness. Psychiatric: Patient has a normal mood and affect. behavior is normal. Judgment and thought content normal.  PHQ2/9: Depression screen [facility] 2/9 04/10/2018 09/17/2017 03/11/2017 11/26/2016 09/16/2016  Decreased Interest 0 0 0 0 0  Down, Depressed, Hopeless 0 0 0 0 0  PHQ - 2 Score 0 0 0 0 0  Altered sleeping 0 - - - -  Tired, decreased energy 0 - - - -  Change in appetite 0 - - - -  Feeling bad or failure about yourself  0 - - - -  Trouble concentrating 0 - - - -  Moving slowly or fidgety/restless 0 - - - -  Suicidal thoughts 0 - - - -  PHQ-9 Score 0 - - - -  Difficult doing work/chores Not difficult at all - - - -    Fall Risk: Fall Risk  08/06/2018 04/10/2018 01/05/2018 09/17/2017 06/18/2017  Falls in the past year? 0 No No No No  Number falls in past yr: 0 - - - -  Injury with Fall? 0 - - - -  Comment - - - - -  Risk for fall due to : - - - - -  Risk for fall due to: Comment - - - - -  Follow up - - - - -     Assessment & Plan   1. Dysmetabolic syndrome  - Semaglutide, 1 MG/DOSE, (OZEMPIC, 1 MG/DOSE,) 2 MG/1.5ML SOPN; Inject 1 mg into the skin once a week.  Dispense: 9 mL; Refill: 0  2. Essential (primary) hypertension  - triamterene-hydrochlorothiazide (MAXZIDE-25) 37.5-25 MG tablet; Take 0.5 tablets by mouth daily.  Dispense: 45 tablet; Refill: 0  3. Perennial allergic rhinitis  - montelukast (SINGULAIR) 10 MG tablet; Take 1 tablet (10 mg total) by mouth daily.  Dispense: 90 tablet; Refill: 1  4. Class 2 obesity due to excess calories with body mass index (BMI) of 39.0 to 39.9 in adult, unspecified whether serious comorbidity present  Discussed with the patient the risk posed by an increased BMI. Discussed importance of portion control, calorie counting and at least 150 minutes of physical activity weekly. Avoid sweet beverages and drink more  water. Eat at least 6 servings of fruit and vegetables daily   She will resume physical activity, she will try to join a gym

## 2018-08-06 NOTE — Patient Instructions (Signed)
[employer]  [employer]

## 2018-09-21 ENCOUNTER — Encounter: Payer: BC Managed Care – PPO | Admitting: Family Medicine

## 2018-11-08 ENCOUNTER — Encounter: Payer: Self-pay | Admitting: Family Medicine

## 2018-11-10 ENCOUNTER — Ambulatory Visit: Payer: BC Managed Care – PPO | Admitting: Family Medicine

## 2018-11-11 ENCOUNTER — Ambulatory Visit (INDEPENDENT_AMBULATORY_CARE_PROVIDER_SITE_OTHER): Payer: BC Managed Care – PPO | Admitting: Family Medicine

## 2018-11-11 ENCOUNTER — Other Ambulatory Visit: Payer: Self-pay

## 2018-11-11 ENCOUNTER — Encounter: Payer: Self-pay | Admitting: Family Medicine

## 2018-11-11 DIAGNOSIS — I10 Essential (primary) hypertension: Secondary | ICD-10-CM | POA: Diagnosis not present

## 2018-11-11 DIAGNOSIS — E8881 Metabolic syndrome: Secondary | ICD-10-CM | POA: Diagnosis not present

## 2018-11-11 DIAGNOSIS — J3089 Other allergic rhinitis: Secondary | ICD-10-CM

## 2018-11-11 MED ORDER — TRIAMTERENE-HCTZ 37.5-25 MG PO TABS: 0.5000 | ORAL_TABLET | Freq: Every day | ORAL | 0 refills | Status: DC

## 2018-11-11 MED ORDER — SEMAGLUTIDE (1 MG/DOSE) 2 MG/1.5ML ~~LOC~~ SOPN: 1.0000 mg | PEN_INJECTOR | SUBCUTANEOUS | 0 refills | Status: DC

## 2018-11-11 NOTE — Progress Notes (Signed)
Name: Kaitlin Schroeder   MRN: [id number]    DOB: 15-Jul-1981   Date:11/11/2018       Progress Note  Subjective  Chief Complaint  Chief Complaint  Patient presents with  . Follow-up    I connected with  Kaitlin MageVanessa Axtman  on 11/11/18 at  8:00 AM EDT by a video enabled telemedicine application and verified that I am speaking with the correct person using two identifiers.  I discussed the limitations of evaluation and management by telemedicine and the availability of in person appointments. The patient expressed understanding and agreed to proceed. Staff also discussed with the patient that there may be a patient responsible charge related to this service. Patient Location: at home  [facility]   HPI  HTN: patient is taking medication and denies side effects, no chest pain,edema,palpitation or SOB.Taking half pill of Maxzideand bp today at home was at goal.   Perennial Allergic Rhinitis: takingLoratadineand singulair daily and nasal spray prn. She states some nasal congestion usually in am. She will resume flonase to see if it helps with medication   Adult Obesity: insurance stopped paying for Qsymia, her original weight was 218 lbs on 04/2013. She was down to 199 lbs in December 2016but [facility] after a gap on medication and she is up to 205.6lbsInsurance stopped paying for Contrave because she was not losing weight. She was given Victoza Dec 2017 id not lose much weight, started on Ozempic 09/2016 at 213 lbs she still eats healthy but discussed avoiding pre-package meals and cut down on sugar. Weight is down to 208 lbs. . She has been walking daily for about 30 minutes, currently working from home She will try resuming garage work outs   Metabolic Syndrome: hgbA1C was5.0% She denies polyphagia, polydipsia or polyuria.She is on Ozempic. No side effects of ozempic    Patient Active Problem List   Diagnosis Date Noted  . Acanthosis nigricans 12/31/2014  . Essential (primary) hypertension 12/31/2014  . Dysmetabolic syndrome 12/31/2014  . Obesity 12/31/2014  . Perennial allergic rhinitis 12/31/2014  . Vitamin D deficiency 12/31/2014    Past Surgical History:  Procedure Laterality Date  . MOUTH SURGERY      Family History  Problem Relation Age of Onset  . Hypertension Mother   . Hypertension Father     Social History   Socioeconomic History  . Marital status: Single    Spouse name: Not on file  . Number of children: 0  . Years of education: Not on file  . Highest education level: Associate degree: academic program  Occupational History  . Occupation: Environmental health practitioneradministrative assistant   Social Needs  . Financial resource strain: Not very hard  . Food insecurity:    Worry: Never true    Inability: Never true  . Transportation needs:    Medical: No    Non-medical: No  Tobacco Use  . Smoking status: Never Smoker  . Smokeless tobacco: Never Used  Substance and Sexual Activity  . Alcohol use: Yes    Alcohol/week: 0.0 standard drinks    Comment: social - a few times a year  . Drug use: No  . Sexual activity: Never    Birth control/protection: Abstinence  Lifestyle  . Physical activity:    Days per week: 3 days    Minutes per session: 30 min  . Stress: Not at all  Relationships  . Social connections:    Talks on phone:  Once a week    Gets together: Never    Attends religious service: More than 4 times per year    Active member of club or organization: No    Attends meetings of clubs or organizations: Never    Relationship status: Never married  . Intimate partner violence:    Fear of current or ex partner: No    Emotionally abused: No    Physically abused: No    Forced sexual activity: No  Other Topics Concern  . Not on file  Social History Narrative   Lives with mother, father ( [place] ) and two sisters.     Current Outpatient Medications:  .  Cholecalciferol  (VITAMIN D) 2000 UNITS tablet, Take 1 tablet by mouth daily., Disp: , Rfl:  .  fluticasone (FLONASE) 50 MCG/ACT nasal spray, Place 2 sprays into both nostrils daily., Disp: 16 g, Rfl: 2 .  loratadine (CLARITIN) 10 MG tablet, Take 1 tablet (10 mg total) by mouth daily., Disp: 100 tablet, Rfl: 2 .  montelukast (SINGULAIR) 10 MG tablet, Take 1 tablet (10 mg total) by mouth daily., Disp: 90 tablet, Rfl: 1 .  Multiple Vitamin tablet, Take 1 tablet by mouth daily., Disp: , Rfl:  .  Semaglutide, 1 MG/DOSE, (OZEMPIC, 1 MG/DOSE,) 2 MG/1.5ML SOPN, Inject 1 mg into the skin once a week., Disp: 9 mL, Rfl: 0 .  triamterene-hydrochlorothiazide (MAXZIDE-25) 37.5-25 MG tablet, Take 0.5 tablets by mouth daily., Disp: 45 tablet, Rfl: 0  No Known Allergies  I personally reviewed active problem list, medication list, allergies, family history, social history with the patient/caregiver today.   ROS  Ten systems reviewed and is negative except as mentioned in HPI   Objective  Virtual encounter, vitals obtained at home .  Body mass index is 39.64 kg/m.  Physical Exam  Awake, alert and oriented   PHQ2/9: Depression screen [facility] 2/9 11/11/2018 04/10/2018 09/17/2017 03/11/2017 11/26/2016  Decreased Interest 0 0 0 0 0  Down, Depressed, Hopeless 0 0 0 0 0  PHQ - 2 Score 0 0 0 0 0  Altered sleeping 0 0 - - -  Tired, decreased energy 0 0 - - -  Change in appetite 0 0 - - -  Feeling bad or failure about yourself  0 0 - - -  Trouble concentrating 0 0 - - -  Moving slowly or fidgety/restless 0 0 - - -  Suicidal thoughts 0 0 - - -  PHQ-9 Score 0 0 - - -  Difficult doing work/chores Not difficult at all Not difficult at all - - -   PHQ-2/9 Result is negative.    Fall Risk: Fall Risk  08/06/2018 04/10/2018 01/05/2018 09/17/2017 06/18/2017  Falls in the past year? 0 No No No No  Number falls in past yr: 0 - - - -  Injury with Fall? 0 - - - -  Comment - - - - -  Risk for fall due to : - - - - -  Risk for fall due  to: Comment - - - - -  Follow up - - - - -     Assessment & Plan  1. Essential (primary) hypertension  - triamterene-hydrochlorothiazide (MAXZIDE-25) 37.5-25 MG tablet; Take 0.5 tablets by mouth daily.  Dispense: 45 tablet; Refill: 0  2. Dysmetabolic syndrome  - Semaglutide, 1 MG/DOSE, (OZEMPIC, 1 MG/DOSE,) 2 MG/1.5ML SOPN; Inject 1 mg into the skin once a week.  Dispense: 9 mL; Refill: 0  3. Perennial allergic rhinitis  Resume Flonase at night    I discussed the assessment and treatment plan with the patient. The patient was provided an opportunity to ask questions and all were answered. The patient agreed with the plan and demonstrated an understanding of the instructions.  The patient was advised to call back or seek an in-person evaluation if the symptoms worsen or if the condition fails to improve as anticipated.  I provided 25 minutes of non-face-to-face time during this encounter.

## 2019-02-06 ENCOUNTER — Other Ambulatory Visit: Payer: Self-pay | Admitting: Family Medicine

## 2019-02-06 DIAGNOSIS — I10 Essential (primary) hypertension: Secondary | ICD-10-CM

## 2019-02-19 ENCOUNTER — Other Ambulatory Visit: Payer: Self-pay

## 2019-02-19 ENCOUNTER — Encounter: Payer: Self-pay | Admitting: Family Medicine

## 2019-02-19 ENCOUNTER — Ambulatory Visit (INDEPENDENT_AMBULATORY_CARE_PROVIDER_SITE_OTHER): Payer: BC Managed Care – PPO | Admitting: Family Medicine

## 2019-02-19 VITALS — BP 108/80 | HR 99 | Temp 97.3°F | Resp 16 | Ht 61.0 in | Wt 207.4 lb

## 2019-02-19 DIAGNOSIS — E8881 Metabolic syndrome: Secondary | ICD-10-CM

## 2019-02-19 DIAGNOSIS — Z01419 Encounter for gynecological examination (general) (routine) without abnormal findings: Secondary | ICD-10-CM | POA: Diagnosis not present

## 2019-02-19 DIAGNOSIS — Z13220 Encounter for screening for lipoid disorders: Secondary | ICD-10-CM

## 2019-02-19 DIAGNOSIS — Z1159 Encounter for screening for other viral diseases: Secondary | ICD-10-CM

## 2019-02-19 DIAGNOSIS — I10 Essential (primary) hypertension: Secondary | ICD-10-CM | POA: Diagnosis not present

## 2019-02-19 DIAGNOSIS — E559 Vitamin D deficiency, unspecified: Secondary | ICD-10-CM

## 2019-02-19 DIAGNOSIS — Z6839 Body mass index (BMI) 39.0-39.9, adult: Secondary | ICD-10-CM

## 2019-02-19 DIAGNOSIS — J3089 Other allergic rhinitis: Secondary | ICD-10-CM | POA: Diagnosis not present

## 2019-02-19 DIAGNOSIS — E6609 Other obesity due to excess calories: Secondary | ICD-10-CM

## 2019-02-19 MED ORDER — HYDROCHLOROTHIAZIDE 12.5 MG PO TABS: 12.5000 mg | ORAL_TABLET | Freq: Every day | ORAL | 1 refills | Status: DC

## 2019-02-19 MED ORDER — MONTELUKAST SODIUM 10 MG PO TABS: 10.0000 mg | ORAL_TABLET | Freq: Every day | ORAL | 1 refills | Status: DC

## 2019-02-19 MED ORDER — TRIAMTERENE-HCTZ 37.5-25 MG PO TABS: 0.5000 | ORAL_TABLET | Freq: Every day | ORAL | 0 refills | Status: DC

## 2019-02-19 MED ORDER — OZEMPIC (1 MG/DOSE) 2 MG/1.5ML ~~LOC~~ SOPN: 1.0000 mg | PEN_INJECTOR | SUBCUTANEOUS | 1 refills | Status: DC

## 2019-02-19 NOTE — Progress Notes (Signed)
Name: Kaitlin Schroeder   MRN: [id number]    DOB: 1981/04/11   Date:02/19/2019       Progress Note  Subjective  Chief Complaint  Chief Complaint  Patient presents with  . Follow-up  . Annual Exam    HPI   Patient presents for annual CPE and follow up   HTN: patient is taking medication and denies side effects, no chest pain,edema,palpitation or SOB.Taking half pill of Maxzideand bp today is towards low end of normal. We will change to HCTZ only   Perennial Allergic Rhinitis: takingLoratadineand singulair daily and nasal spray prn. She states some nasal congestion usually in am.Unchanged   Adult Obesity: insurance stopped paying for Qsymia, her original weight was 218 lbs on 04/2013. She was down to 199 lbs in December 2016but medication had to be switched to [facility] around Feb 2017 after a gap on medication and she is up to 205.6lbsInsurance stopped paying for Contrave because she was not losing weight. She was given Victoza Dec 2017 id not lose much weight, started on Ozempic 09/2016 at 213 lbs. She is doing well even through the pandemic weight is down to 207.4 lbs. She has been playing in her pool and still going for walks but not as active as she used to be, still following a healthy diet   Metabolic Syndrome: KKXF8H was5.0% She denies polyphagia, polydipsia or polyuria.She is on Ozempic. No side effects of ozempic Discussed carbohydrates restrictive diet and explained she may need to cut down on Ozempic if she starts it    Diet: eating balanced diet, oatmeal for breakfast, lean cuisine or P&B for lunch and dinner at home Exercise: some swimming and walking occasionally   USPSTF grade A and B recommendations    Office Visit from 04/10/2018 in [facility]  AUDIT-C Score  0     Depression: Phq 9 is  negative Depression screen [facility] 2/9 02/19/2019 11/11/2018 04/10/2018 09/17/2017 03/11/2017  Decreased Interest 0 0 0 0 0  Down, Depressed, Hopeless 0 0 0 0  0  PHQ - 2 Score 0 0 0 0 0  Altered sleeping 0 0 0 - -  Tired, decreased energy 0 0 0 - -  Change in appetite 0 0 0 - -  Feeling bad or failure about yourself  0 0 0 - -  Trouble concentrating 0 0 0 - -  Moving slowly or fidgety/restless 0 0 0 - -  Suicidal thoughts 0 0 0 - -  PHQ-9 Score 0 0 0 - -  Difficult doing work/chores - Not difficult at all Not difficult at all - -   Hypertension: BP Readings from Last 3 Encounters:  02/19/19 108/80  11/11/18 117/78  08/06/18 130/90   Obesity: Wt Readings from Last 3 Encounters:  02/19/19 207 lb 6.4 oz (94.1 kg)  11/11/18 209 lb 12.8 oz (95.2 kg)  08/06/18 211 lb 8 oz (95.9 kg)   BMI Readings from Last 3 Encounters:  02/19/19 39.19 kg/m  11/11/18 39.64 kg/m  08/06/18 39.96 kg/m    Hep C Screening: today  STD testing and prevention (HIV/chl/gon/syphilis): N/A Intimate partner violence: negative screen  Sexual History/Pain during Intercourse: N/A  Menstrual History/LMP/Abnormal Bleeding: regular cycles, not very painful now, she has some cramping the week before  Incontinence Symptoms: no symptoms   Advanced Care Planning: A voluntary discussion about advance care planning including the explanation and discussion of advance directives.  Discussed health care proxy and Living will, and the patient was able  to identify a health care proxy as parents .  Patient does not have a living will at present time.  Breast cancer:discussed USPTF BRCA gene screening: N/A Cervical cancer screening: repeat in 2023   Osteoporosis Screening: discussed high calcium and vitamin D diet   Lipids:  Lab Results  Component Value Date   CHOL 165 09/17/2017   CHOL 143 09/16/2016   CHOL 167 12/21/2015   Lab Results  Component Value Date   HDL 42 (L) 09/17/2017   HDL 41 (L) 09/16/2016   HDL 44 12/21/2015   Lab Results  Component Value Date   LDLCALC 103 (H) 09/17/2017   LDLCALC 80 09/16/2016   LDLCALC 103 (H) 12/21/2015   Lab Results   Component Value Date   TRIG 104 09/17/2017   TRIG 109 09/16/2016   TRIG 100 12/21/2015   Lab Results  Component Value Date   CHOLHDL 3.9 09/17/2017   CHOLHDL 3.5 09/16/2016   CHOLHDL 3.8 12/21/2015   No results found for: LDLDIRECT  Glucose:  Glucose  Date Value Ref Range Status  12/21/2015 73 65 - 99 mg/dL Final   Glucose, Bld  Date Value Ref Range Status  09/17/2017 88 65 - 99 mg/dL Final    Comment:    .            Fasting reference interval .   09/16/2016 80 65 - 99 mg/dL Final    Skin cancer: discussed atypical lesions  Colorectal cancer: discussed USPTF, start at 52   Lung cancer:   Low Dose CT Chest recommended if Age 5-80 years, 30 pack-year currently smoking OR have quit w/in 15years. Patient does not qualify.   GOT:LXBWI   Patient Active Problem List   Diagnosis Date Noted  . Acanthosis nigricans 12/31/2014  . Essential (primary) hypertension 12/31/2014  . Dysmetabolic syndrome 20/35/5974  . Obesity 12/31/2014  . Perennial allergic rhinitis 12/31/2014  . Vitamin D deficiency 12/31/2014    Past Surgical History:  Procedure Laterality Date  . MOUTH SURGERY      Family History  Problem Relation Age of Onset  . Hypertension Mother   . Hypertension Father     Social History   Socioeconomic History  . Marital status: Single    Spouse name: Not on file  . Number of children: 0  . Years of education: Not on file  . Highest education level: Associate degree: academic program  Occupational History  . Occupation: Web designer   Social Needs  . Financial resource strain: Not very hard  . Food insecurity    Worry: Never true    Inability: Never true  . Transportation needs    Medical: No    Non-medical: No  Tobacco Use  . Smoking status: Never Smoker  . Smokeless tobacco: Never Used  Substance and Sexual Activity  . Alcohol use: Yes    Alcohol/week: 0.0 standard drinks    Comment: social - a few times a year  . Drug use: No   . Sexual activity: Never    Birth control/protection: Abstinence  Lifestyle  . Physical activity    Days per week: 3 days    Minutes per session: 30 min  . Stress: Not at all  Relationships  . Social Herbalist on phone: Once a week    Gets together: Never    Attends religious service: More than 4 times per year    Active member of club or organization: No    Attends meetings  of clubs or organizations: Never    Relationship status: Never married  . Intimate partner violence    Fear of current or ex partner: No    Emotionally abused: No    Physically abused: No    Forced sexual activity: No  Other Topics Concern  . Not on file  Social History Narrative   Lives with mother, father ( [place] ) and two sisters.     Current Outpatient Medications:  .  Cholecalciferol (VITAMIN D) 2000 UNITS tablet, Take 1 tablet by mouth daily., Disp: , Rfl:  .  fluticasone (FLONASE) 50 MCG/ACT nasal spray, Place 2 sprays into both nostrils daily., Disp: 16 g, Rfl: 2 .  loratadine (CLARITIN) 10 MG tablet, Take 1 tablet (10 mg total) by mouth daily., Disp: 100 tablet, Rfl: 2 .  montelukast (SINGULAIR) 10 MG tablet, Take 1 tablet (10 mg total) by mouth daily., Disp: 90 tablet, Rfl: 1 .  Multiple Vitamin tablet, Take 1 tablet by mouth daily., Disp: , Rfl:  .  Semaglutide, 1 MG/DOSE, (OZEMPIC, 1 MG/DOSE,) 2 MG/1.5ML SOPN, Inject 1 mg into the skin once a week., Disp: 9 mL, Rfl: 1 .  triamterene-hydrochlorothiazide (MAXZIDE-25) 37.5-25 MG tablet, Take 0.5 tablets by mouth daily., Disp: 45 tablet, Rfl: 0  No Known Allergies   ROS  Constitutional: Negative for fever or weight change.  Respiratory: Negative for cough and shortness of breath.   Cardiovascular: Negative for chest pain or palpitations.  Gastrointestinal: Negative for abdominal pain, no bowel changes.  Musculoskeletal: Negative for gait problem or joint swelling.  Skin: Negative for rash.  Neurological: Negative for dizziness or  headache.  No other specific complaints in a complete review of systems (except as listed in HPI above).  Objective  Vitals:   02/19/19 0756  BP: 108/80  Pulse: 99  Resp: 16  Temp: (!) 97.3 F (36.3 C)  TempSrc: Temporal  SpO2: 94%  Weight: 207 lb 6.4 oz (94.1 kg)  Height: _0  (1.549 m)    Body mass index is 39.19 kg/m.  Physical Exam  Constitutional: Patient appears well-developed and well-nourished. No distress.  HENT: Head: Normocephalic and atraumatic. Ears: B TMs ok, no erythema or effusion; Nose: Nose normal. Mouth/Throat: Oropharynx is clear and moist. No oropharyngeal exudate.  Eyes: Conjunctivae and EOM are normal. Pupils are equal, round, and reactive to light. No scleral icterus.  Neck: Normal range of motion. Neck supple. No JVD present. No thyromegaly present.  Cardiovascular: Normal rate, regular rhythm and normal heart sounds.  No murmur heard. No BLE edema. Pulmonary/Chest: Effort normal and breath sounds normal. No respiratory distress. Abdominal: Soft. Bowel sounds are normal, no distension. There is no tenderness. no masses Breast: no lumps or masses, no nipple discharge or rashes FEMALE GENITALIA:  Not done RECTAL: not done Musculoskeletal: Normal range of motion, no joint effusions. No gross deformities Neurological: he is alert and oriented to person, place, and time. No cranial nerve deficit. Coordination, balance, strength, speech and gait are normal.  Skin: Skin is warm and dry. No rash noted. No erythema.  Psychiatric: Patient has a normal mood and affect. behavior is normal. Judgment and thought content normal.  PHQ2/9: Depression screen [facility] 2/9 02/19/2019 11/11/2018 04/10/2018 09/17/2017 03/11/2017  Decreased Interest 0 0 0 0 0  Down, Depressed, Hopeless 0 0 0 0 0  PHQ - 2 Score 0 0 0 0 0  Altered sleeping 0 0 0 - -  Tired, decreased energy 0 0 0 - -  Change  in appetite 0 0 0 - -  Feeling bad or failure about yourself  0 0 0 - -  Trouble  concentrating 0 0 0 - -  Moving slowly or fidgety/restless 0 0 0 - -  Suicidal thoughts 0 0 0 - -  PHQ-9 Score 0 0 0 - -  Difficult doing work/chores - Not difficult at all Not difficult at all - -    Fall Risk: Fall Risk  02/19/2019 11/11/2018 08/06/2018 04/10/2018 01/05/2018  Falls in the past year? 0 0 0 No No  Number falls in past yr: 0 0 0 - -  Injury with Fall? 0 0 0 - -  Comment - - - - -  Risk for fall due to : - - - - -  Risk for fall due to: Comment - - - - -  Follow up - - - - -     Functional Status Survey: Is the patient deaf or have difficulty hearing?: No Does the patient have difficulty seeing, even when wearing glasses/contacts?: No Does the patient have difficulty concentrating, remembering, or making decisions?: No Does the patient have difficulty walking or climbing stairs?: No Does the patient have difficulty dressing or bathing?: No Does the patient have difficulty doing errands alone such as visiting a doctor's office or shopping?: No   Assessment & Plan   1. Well woman exam   2. Essential (primary) hypertension  - EKG 12-Lead - CBC with Differential/Platelet - COMPLETE METABOLIC PANEL WITH GFR - hydrochlorothiazide (HYDRODIURIL) 12.5 MG tablet; Take 1 tablet (12.5 mg total) by mouth daily.  Dispense: 90 tablet; Refill: 1  3. Dysmetabolic syndrome  - COMPLETE METABOLIC PANEL WITH GFR - Hemoglobin A1c - Lipid panel - Semaglutide, 1 MG/DOSE, (OZEMPIC, 1 MG/DOSE,) 2 MG/1.5ML SOPN; Inject 1 mg into the skin once a week.  Dispense: 9 mL; Refill: 1  4. Perennial allergic rhinitis  - montelukast (SINGULAIR) 10 MG tablet; Take 1 tablet (10 mg total) by mouth daily.  Dispense: 90 tablet; Refill: 1  5. Class 2 obesity due to excess calories with body mass index (BMI) of 39.0 to 39.9 in adult, unspecified whether serious comorbidity present  Discussed carbohydrate restrictive diet  6. Vitamin D deficiency  - VITAMIN D 25 Hydroxy (Vit-D Deficiency,  Fractures)  7. Lipid screening  - Lipid panel  -USPSTF grade A and B recommendations reviewed with patient; age-appropriate recommendations, preventive care, screening tests, etc discussed and encouraged; healthy living encouraged; see AVS for patient education given to patient -Discussed importance of 150 minutes of physical activity weekly, eat two servings of fish weekly, eat one serving of tree nuts ( cashews, pistachios, pecans, almonds.Marland Kitchen) every other day, eat 6 servings of fruit/vegetables daily and drink plenty of water and avoid sweet beverages.

## 2019-02-22 LAB — COMPLETE METABOLIC PANEL WITH GFR
AG Ratio: 1.6 (calc) (ref 1.0–2.5)
ALT: 20 U/L (ref 6–29)
AST: 20 U/L (ref 10–30)
Albumin: 4.2 g/dL (ref 3.6–5.1)
Alkaline phosphatase (APISO): 47 U/L (ref 31–125)
BUN: 10 mg/dL (ref 7–25)
CO2: 24 mmol/L (ref 20–32)
Calcium: 9.6 mg/dL (ref 8.6–10.2)
Chloride: 104 mmol/L (ref 98–110)
Creat: 1.06 mg/dL (ref 0.50–1.10)
GFR, Est African American: 77 mL/min/{1.73_m2} (ref >=60)
GFR, Est Non African American: 66 mL/min/{1.73_m2} (ref >=60)
Globulin: 2.7 g/dL (calc) (ref 1.9–3.7)
Glucose, Bld: 82 mg/dL (ref 65–99)
Potassium: 3.8 mmol/L (ref 3.5–5.3)
Sodium: 139 mmol/L (ref 135–146)
Total Bilirubin: 0.3 mg/dL (ref 0.2–1.2)
Total Protein: 6.9 g/dL (ref 6.1–8.1)

## 2019-02-22 LAB — CBC WITH DIFFERENTIAL/PLATELET
Absolute Monocytes: 447 cells/uL (ref 200–950)
Basophils Absolute: 41 cells/uL (ref 0–200)
Basophils Relative: 0.7 %
Eosinophils Absolute: 139 cells/uL (ref 15–500)
Eosinophils Relative: 2.4 %
HCT: 40.2 % (ref 35.0–45.0)
Hemoglobin: 14.4 g/dL (ref 11.7–15.5)
Lymphs Abs: 2134 cells/uL (ref 850–3900)
MCH: 32.8 pg (ref 27.0–33.0)
MCHC: 35.8 g/dL (ref 32.0–36.0)
MCV: 91.6 fL (ref 80.0–100.0)
MPV: 10.1 fL (ref 7.5–12.5)
Monocytes Relative: 7.7 %
Neutro Abs: 3039 cells/uL (ref 1500–7800)
Neutrophils Relative %: 52.4 %
Platelets: 362 10*3/uL (ref 140–400)
RBC: 4.39 10*6/uL (ref 3.80–5.10)
RDW: 12.7 % (ref 11.0–15.0)
Total Lymphocyte: 36.8 %
WBC: 5.8 10*3/uL (ref 3.8–10.8)

## 2019-02-22 LAB — LIPID PANEL
Cholesterol: 171 mg/dL (ref <200)
HDL: 38 mg/dL — ABNORMAL LOW (ref >=50)
LDL Cholesterol (Calc): 109 mg/dL (calc) — ABNORMAL HIGH
Non-HDL Cholesterol (Calc): 133 mg/dL (calc) — ABNORMAL HIGH (ref <130)
Total CHOL/HDL Ratio: 4.5 (calc) (ref <5.0)
Triglycerides: 125 mg/dL (ref <150)

## 2019-02-22 LAB — VITAMIN D 25 HYDROXY (VIT D DEFICIENCY, FRACTURES): Vit D, 25-Hydroxy: 47 ng/mL (ref 30–100)

## 2019-02-22 LAB — HEPATITIS C ANTIBODY
Hepatitis C Ab: NONREACTIVE
SIGNAL TO CUT-OFF: 0.06 (ref <1.00)

## 2019-02-22 LAB — HEMOGLOBIN A1C
Hgb A1c MFr Bld: 5.2 % of total Hgb (ref <5.7)
Mean Plasma Glucose: 103 (calc)
eAG (mmol/L): 5.7 (calc)

## 2019-04-22 ENCOUNTER — Ambulatory Visit: Payer: BC Managed Care – PPO

## 2019-04-29 ENCOUNTER — Ambulatory Visit (INDEPENDENT_AMBULATORY_CARE_PROVIDER_SITE_OTHER): Payer: BC Managed Care – PPO

## 2019-04-29 ENCOUNTER — Other Ambulatory Visit: Payer: Self-pay

## 2019-04-29 DIAGNOSIS — Z23 Encounter for immunization: Secondary | ICD-10-CM | POA: Diagnosis not present

## 2019-08-08 ENCOUNTER — Other Ambulatory Visit: Payer: Self-pay | Admitting: Family Medicine

## 2019-08-08 DIAGNOSIS — E8881 Metabolic syndrome: Secondary | ICD-10-CM

## 2019-08-08 NOTE — Telephone Encounter (Signed)
Requested Prescriptions  Pending Prescriptions Disp Refills  . OZEMPIC, 1 MG/DOSE, 2 MG/1.5ML SOPN [Pharmacy Med Name: OZEMPIC 1 MG DOSE PEN] 9 pen 1    Sig: INJECT 1 MG INTO THE SKIN ONCE A WEEK.     Endocrinology:  Diabetes - GLP-1 Receptor Agonists Passed - 08/08/2019  9:43 AM      Passed - HBA1C is between 0 and 7.9 and within 180 days    Hgb A1c MFr Bld  Date Value Ref Range Status  02/19/2019 5.2 <5.7 % of total Hgb Final    Comment:    For the purpose of screening for the presence of diabetes: . <5.7%       Consistent with the absence of diabetes 5.7-6.4%    Consistent with increased risk for diabetes             (prediabetes) > or =6.5%  Consistent with diabetes . This assay result is consistent with a decreased risk of diabetes. . Currently, no consensus exists regarding use of hemoglobin A1c for diagnosis of diabetes in children. . According to American Diabetes Association (ADA) guidelines, hemoglobin A1c <7.0% represents optimal control in non-pregnant diabetic patients. Different metrics may apply to specific patient populations.  Standards of Medical Care in Diabetes(ADA). Renella Cunas - Valid encounter within last 6 months    Recent Outpatient Visits          5 months ago Essential (primary) hypertension   [facility] Steele Sizer, MD   9 months ago Essential (primary) hypertension   [facility] Steele Sizer, MD   1 year ago Essential (primary) hypertension   [facility] Steele Sizer, MD   1 year ago Essential (primary) hypertension   [facility] Steele Sizer, MD   1 year ago Need for MMR vaccine   [facility] Steele Sizer, MD      Future Appointments            In 3 weeks Steele Sizer, MD [facility]

## 2019-08-21 ENCOUNTER — Other Ambulatory Visit: Payer: Self-pay | Admitting: Family Medicine

## 2019-08-21 DIAGNOSIS — I10 Essential (primary) hypertension: Secondary | ICD-10-CM

## 2019-08-21 NOTE — Telephone Encounter (Signed)
Requested Prescriptions  Pending Prescriptions Disp Refills  . hydrochlorothiazide (HYDRODIURIL) 12.5 MG tablet [Pharmacy Med Name: HYDROCHLOROTHIAZIDE 12.5 MG TB] 90 tablet 0    Sig: TAKE 1 TABLET BY MOUTH EVERY DAY     Cardiovascular: Diuretics - Thiazide Failed - 08/21/2019  9:52 AM      Failed - Valid encounter within last 6 months    Recent Outpatient Visits          6 months ago Essential (primary) hypertension   [facility], Drue Stager, MD   9 months ago Essential (primary) hypertension   [facility], Drue Stager, MD   1 year ago Essential (primary) hypertension   [facility] Steele Sizer, MD   1 year ago Essential (primary) hypertension   [facility] Steele Sizer, MD   1 year ago Need for MMR vaccine   [facility] Steele Sizer, MD      Future Appointments            In 1 week Steele Sizer, MD [facility] [place] in normal range and within 360 days    Calcium  Date Value Ref Range Status  02/19/2019 9.6 8.6 - 10.2 mg/dL Final         Passed - Cr in normal range and within 360 days    Creat  Date Value Ref Range Status  02/19/2019 1.06 0.50 - 1.10 mg/dL Final         Passed - K in normal range and within 360 days    Potassium  Date Value Ref Range Status  02/19/2019 3.8 3.5 - 5.3 mmol/L Final         Passed - Na in normal range and within 360 days    Sodium  Date Value Ref Range Status  02/19/2019 139 135 - 146 mmol/L Final  12/21/2015 142 134 - 144 mmol/L Final         Passed - Last BP in normal range    BP Readings from Last 1 Encounters:  02/19/19 108/80

## 2019-09-02 ENCOUNTER — Other Ambulatory Visit: Payer: Self-pay

## 2019-09-02 ENCOUNTER — Ambulatory Visit (INDEPENDENT_AMBULATORY_CARE_PROVIDER_SITE_OTHER): Payer: BC Managed Care – PPO | Admitting: Family Medicine

## 2019-09-02 ENCOUNTER — Encounter: Payer: Self-pay | Admitting: Family Medicine

## 2019-09-02 VITALS — BP 112/75 | HR 80 | Ht 61.0 in

## 2019-09-02 DIAGNOSIS — I10 Essential (primary) hypertension: Secondary | ICD-10-CM | POA: Diagnosis not present

## 2019-09-02 DIAGNOSIS — J3089 Other allergic rhinitis: Secondary | ICD-10-CM | POA: Diagnosis not present

## 2019-09-02 DIAGNOSIS — Z6839 Body mass index (BMI) 39.0-39.9, adult: Secondary | ICD-10-CM

## 2019-09-02 DIAGNOSIS — E559 Vitamin D deficiency, unspecified: Secondary | ICD-10-CM | POA: Diagnosis not present

## 2019-09-02 DIAGNOSIS — E8881 Metabolic syndrome: Secondary | ICD-10-CM

## 2019-09-02 DIAGNOSIS — E66812 Obesity, class 2: Secondary | ICD-10-CM

## 2019-09-02 DIAGNOSIS — E6609 Other obesity due to excess calories: Secondary | ICD-10-CM

## 2019-09-02 MED ORDER — MONTELUKAST SODIUM 10 MG PO TABS: 10.0000 mg | ORAL_TABLET | Freq: Every day | ORAL | 1 refills | Status: DC

## 2019-09-02 MED ORDER — HYDROCHLOROTHIAZIDE 12.5 MG PO TABS: 12.5000 mg | ORAL_TABLET | Freq: Every day | ORAL | 0 refills | Status: DC

## 2019-09-02 NOTE — Progress Notes (Signed)
Name: Kaitlin Schroeder   MRN: [id number]    DOB: Feb 05, 1981   Date:09/02/2019       Progress Note  Subjective  Chief Complaint  Chief Complaint  Patient presents with  . Follow-up    6 month follow up    I connected with  Einar Pheasant  on 09/02/19 at  8:20 AM EST by a video enabled telemedicine application and verified that I am speaking with the correct person using two identifiers.  I discussed the limitations of evaluation and management by telemedicine and the availability of in person appointments. The patient expressed understanding and agreed to proceed. Staff also discussed with the patient that there may be a patient responsible charge related to this service. Patient Location: at home Provider Location: at home   HPI  HTN: patient is taking medication and denies side effects, no chest pain,edema,palpitation or SOB.She is now off Maxzide and is now on hctz, bp was high when running around to find bp cuff but back to normal with rest  Perennial Allergic Rhinitis: takingLoratadineand singulair daily and nasal spray, but not as consistent with using nasal spray and is having more nasal congestion . Otherwise doing well   Adult Obesity: insurance stopped paying for Qsymia, her original weight was 218 lbs on 04/2013. She was down to 199 lbs in December 2016but medication had to be switched to [facility] around Feb 2017 after a gap on medication and she is up to 205.6lbsInsurance stopped paying for Contrave because she was not losing weight. She was given Victoza Dec 2017 id not lose much weight, started on Ozempic 09/2016 at 213 lbs. She is stable gained 2 bs since last visit  She has been exercise a few times a week and would like to increase to daily again, also more sedentary since working from home   Metabolic Syndrome: DXAJ2I was5.2% She denies polyphagia, polydipsia or polyuria.She is on Ozempic.No side effects of ozempicShe is on low calories diet, she did not try  the carbohydrate restrictive diet   Patient Active Problem List   Diagnosis Date Noted  . Acanthosis nigricans 12/31/2014  . Essential (primary) hypertension 12/31/2014  . Dysmetabolic syndrome 78/67/6720  . Obesity 12/31/2014  . Perennial allergic rhinitis 12/31/2014  . Vitamin D deficiency 12/31/2014    Past Surgical History:  Procedure Laterality Date  . MOUTH SURGERY      Family History  Problem Relation Age of Onset  . Hypertension Mother   . Hypertension Father    Social History   Substance and Sexual Activity  Alcohol Use Yes  . Alcohol/week: 0.0 standard drinks   Comment: social - a few times a year     Tobacco Use: Low Risk   . Smoking Tobacco Use: Never Smoker  . Smokeless Tobacco Use: Never Used    Current Outpatient Medications:  .  Cholecalciferol (VITAMIN D) 2000 UNITS tablet, Take 1 tablet by mouth daily., Disp: , Rfl:  .  fluticasone (FLONASE) 50 MCG/ACT nasal spray, Place 2 sprays into both nostrils daily., Disp: 16 g, Rfl: 2 .  hydrochlorothiazide (HYDRODIURIL) 12.5 MG tablet, TAKE 1 TABLET BY MOUTH EVERY DAY, Disp: 90 tablet, Rfl: 0 .  loratadine (CLARITIN) 10 MG tablet, Take 1 tablet (10 mg total) by mouth daily., Disp: 100 tablet, Rfl: 2 .  montelukast (SINGULAIR) 10 MG tablet, Take 1 tablet (10 mg total) by mouth daily., Disp: 90 tablet, Rfl: 1 .  Multiple Vitamin tablet, Take 1 tablet by mouth daily., Disp: , Rfl:  .  OZEMPIC, 1 MG/DOSE, 2 MG/1.5ML SOPN, INJECT 1 MG INTO THE SKIN ONCE A WEEK., Disp: 9 pen, Rfl: 1  No Known Allergies  I personally reviewed active problem list, family history , social history of medications list  with the patient/caregiver today.   ROS  Ten systems reviewed and is negative except as mentioned in HPI   Objective  Virtual encounter, vitals  Obtained at home  Today's Vitals   09/02/19 0746 09/02/19 0827  BP: (!) 148/97 112/75  Pulse: 80   Height: 5\' 1"  (1.549 m)    Body mass index is 39.19 kg/m.    Physical Exam  Awake, alert and oriented    PHQ2/9: Depression screen [facility] 2/9 09/02/2019 02/19/2019 11/11/2018 04/10/2018 09/17/2017  Decreased Interest 0 0 0 0 0  Down, Depressed, Hopeless 0 0 0 0 0  PHQ - 2 Score 0 0 0 0 0  Altered sleeping 0 0 0 0 -  Tired, decreased energy 0 0 0 0 -  Change in appetite 0 0 0 0 -  Feeling bad or failure about yourself  0 0 0 0 -  Trouble concentrating 0 0 0 0 -  Moving slowly or fidgety/restless 0 0 0 0 -  Suicidal thoughts 0 0 0 0 -  PHQ-9 Score 0 0 0 0 -  Difficult doing work/chores Not difficult at all - Not difficult at all Not difficult at all -   PHQ-2/9 Result is negative.    Fall Risk: Fall Risk  09/02/2019 02/19/2019 11/11/2018 08/06/2018 04/10/2018  Falls in the past year? 0 0 0 0 No  Number falls in past yr: 0 0 0 0 -  Injury with Fall? 0 0 0 0 -  Comment - - - - -  Risk for fall due to : - - - - -  Risk for fall due to: Comment - - - - -  Follow up - - - - -     Assessment & Plan  1. Essential (primary) hypertension  At goal  - hydrochlorothiazide (HYDRODIURIL) 12.5 MG tablet; Take 1 tablet (12.5 mg total) by mouth daily.  Dispense: 90 tablet; Refill: 0  2. Perennial allergic rhinitis  - montelukast (SINGULAIR) 10 MG tablet; Take 1 tablet (10 mg total) by mouth daily.  Dispense: 90 tablet; Refill: 1  3. Dysmetabolic syndrome  Doing well on medication  4. Vitamin D deficiency   5. Class 2 obesity due to excess calories with body mass index (BMI) of 39.0 to 39.9 in adult, unspecified whether serious comorbidity present  Discussed calorie counting and go down by 200 calories per day gradually to get to a max of 1800 calories per day   I discussed the assessment and treatment plan with the patient. The patient was provided an opportunity to ask questions and all were answered. The patient agreed with the plan and demonstrated an understanding of the instructions.  The patient was advised to call back or seek an in-person  evaluation if the symptoms worsen or if the condition fails to improve as anticipated.  I provided 25 minutes of non-face-to-face time during this encounter.

## 2019-11-03 ENCOUNTER — Encounter: Payer: Self-pay | Admitting: Family Medicine

## 2019-11-18 ENCOUNTER — Other Ambulatory Visit: Payer: Self-pay | Admitting: Family Medicine

## 2019-11-18 DIAGNOSIS — I10 Essential (primary) hypertension: Secondary | ICD-10-CM

## 2020-02-05 ENCOUNTER — Other Ambulatory Visit: Payer: Self-pay | Admitting: Family Medicine

## 2020-02-05 DIAGNOSIS — J3089 Other allergic rhinitis: Secondary | ICD-10-CM

## 2020-02-05 DIAGNOSIS — E8881 Metabolic syndrome: Secondary | ICD-10-CM

## 2020-02-05 NOTE — Telephone Encounter (Signed)
Requested Prescriptions  Pending Prescriptions Disp Refills  . OZEMPIC, 1 MG/DOSE, 2 MG/1.5ML SOPN [Pharmacy Med Name: OZEMPIC 1 MG DOSE PEN (1.5 ML)] 9 pen 1    Sig: INJECT 1 MG INTO THE SKIN ONCE A WEEK.     Endocrinology:  Diabetes - GLP-1 Receptor Agonists Failed - 02/05/2020 10:21 AM      Failed - HBA1C is between 0 and 7.9 and within 180 days    Hgb A1c MFr Bld  Date Value Ref Range Status  02/19/2019 5.2 <5.7 % of total Hgb Final    Comment:    For the purpose of screening for the presence of diabetes: . <5.7%       Consistent with the absence of diabetes 5.7-6.4%    Consistent with increased risk for diabetes             (prediabetes) > or =6.5%  Consistent with diabetes . This assay result is consistent with a decreased risk of diabetes. . Currently, no consensus exists regarding use of hemoglobin A1c for diagnosis of diabetes in children. . According to American Diabetes Association (ADA) guidelines, hemoglobin A1c <7.0% represents optimal control in non-pregnant diabetic patients. Different metrics may apply to specific patient populations.  Standards of Medical Care in Diabetes(ADA). Verna Czech - Valid encounter within last 6 months    Recent Outpatient Visits          5 months ago Essential (primary) hypertension   [facility] Alba Cory, MD   11 months ago Essential (primary) hypertension   [facility] Alba Cory, MD   1 year ago Essential (primary) hypertension   [facility] Alba Cory, MD   1 year ago Essential (primary) hypertension   [facility] Alba Cory, MD   1 year ago Essential (primary) hypertension   [facility] Alba Cory, MD      Future Appointments            In 3 weeks Alba Cory, MD [facility]           . montelukast (SINGULAIR) 10 MG tablet [Pharmacy Med Name:  MONTELUKAST SOD 10 MG TABLET] 90 tablet 1    Sig: TAKE 1 TABLET BY MOUTH EVERY DAY     Pulmonology:  Leukotriene Inhibitors Passed - 02/05/2020 10:21 AM      Passed - Valid encounter within last 12 months    Recent Outpatient Visits          5 months ago Essential (primary) hypertension   [facility] Alba Cory, MD   11 months ago Essential (primary) hypertension   [facility] Alba Cory, MD   1 year ago Essential (primary) hypertension   [facility] Alba Cory, MD   1 year ago Essential (primary) hypertension   [facility] Alba Cory, MD   1 year ago Essential (primary) hypertension   [facility] Alba Cory, MD      Future Appointments            In 3 weeks Alba Cory, MD [facility]

## 2020-03-01 ENCOUNTER — Encounter: Payer: Self-pay | Admitting: Family Medicine

## 2020-03-01 NOTE — Progress Notes (Signed)
Name: Kaitlin Schroeder   MRN: [id number]    DOB: 1980-08-21   Date:03/02/2020       Progress Note  Subjective  Chief Complaint  Chief Complaint  Patient presents with  . Annual Exam    HPI  Patient presents for annual CPE and follow up  HTN: patient is taking medication and denies side effects, no chest pain,edema,palpitation or SOB.She is now off Maxzide and is  on hctz, DBP is borderline today, we will recheck before she left.   Perennial Allergic Rhinitis: takingLoratadineand singulair daily and nasal spray, not consistent with nasal spray and ear fullness, she would like a refill of nasal spray today   Adult Obesity: insurance stopped paying for Qsymia, her original weight was 218 lbs on 04/2013. She was down to 199 lbs in December 2016but medication had to be switched to [facility] around Feb 2017 after a gap on medication and she is up to 205.6lbsInsurance stopped paying for Contrave because she was not losing weight. She was given Victoza Dec 2017 id not lose much weight, started on Ozempic 09/2016 at 213 lbs. Today weight is 210 lbs, she decided that she will stop Ozempic once she runs out of the medications she has at home and just follow a healthy diet and exercise   Metabolic Syndrome: GDJM4Q was5.2% She denies polyphagia, polydipsia or polyuria.She is on Ozempic, but wants to stop medication and try to be physically active and eat healthy. She is on low calories diet  Diet: discussed importance of high calcium diet, lots of fish, fruit and vegetables  USPSTF grade A and B recommendations     Office Visit from 03/02/2020 in [facility]  AUDIT-C Score 0     Depression: Phq 9 is  negative Depression screen [facility] 2/9 03/02/2020 09/02/2019 02/19/2019 11/11/2018 04/10/2018  Decreased Interest 0 0 0 0 0  Down, Depressed, Hopeless 0 0 0 0 0  PHQ - 2 Score 0 0 0 0 0  Altered sleeping 0 0 0 0 0  Tired, decreased energy 0 0 0 0 0  Change in appetite 0 0 0 0  0  Feeling bad or failure about yourself  0 0 0 0 0  Trouble concentrating 0 0 0 0 0  Moving slowly or fidgety/restless 0 0 0 0 0  Suicidal thoughts 0 0 0 0 0  PHQ-9 Score 0 0 0 0 0  Difficult doing work/chores - Not difficult at all - Not difficult at all Not difficult at all   Hypertension: BP Readings from Last 3 Encounters:  03/02/20 130/90  09/02/19 112/75  02/19/19 108/80   Obesity: Wt Readings from Last 3 Encounters:  03/02/20 210 lb 11.2 oz (95.6 kg)  02/19/19 207 lb 6.4 oz (94.1 kg)  11/11/18 209 lb 12.8 oz (95.2 kg)   BMI Readings from Last 3 Encounters:  03/02/20 40.14 kg/m  09/02/19 39.19 kg/m  02/19/19 39.19 kg/m     Hep C Screening: up to date   STD testing and prevention (HIV/chl/gon/syphilis): not interested  Intimate partner violence: negative screen  Sexual History (Partners/Practices/Protection from [employer] hx STI/Pregnancy Plans): not interested  Menstrual History/LMP/Abnormal Bleeding:  Incontinence Symptoms:   Breast cancer:  - Last Mammogram: we will schedule first one  - BRCA gene screening: two maternal aunts had breast cancer, she wants to hold off on testing   Osteoporosis: Discussed high calcium and vitamin D supplementation, weight bearing exercises  Cervical cancer screening: repeat in 2023   Skin cancer:  Discussed monitoring for atypical lesions  Colorectal cancer: start at 61  Lung cancer:  Low Dose CT Chest recommended if Age 70-80 years, 30 pack-year currently smoking OR have quit w/in 15years. Patient does not qualify.   ECG: 02/19/2019  Advanced Care Planning: A voluntary discussion about advance care planning including the explanation and discussion of advance directives.  Discussed health care proxy and Living will, and the patient was able to identify a health care proxy as her mother . Patient does not have a living will at present time. If patient does have living will, I have requested they bring this to the clinic to be  scanned in to their chart.  Lipids: Lab Results  Component Value Date   CHOL 171 02/19/2019   CHOL 165 09/17/2017   CHOL 143 09/16/2016   Lab Results  Component Value Date   HDL 38 (L) 02/19/2019   HDL 42 (L) 09/17/2017   HDL 41 (L) 09/16/2016   Lab Results  Component Value Date   LDLCALC 109 (H) 02/19/2019   LDLCALC 103 (H) 09/17/2017   LDLCALC 80 09/16/2016   Lab Results  Component Value Date   TRIG 125 02/19/2019   TRIG 104 09/17/2017   TRIG 109 09/16/2016   Lab Results  Component Value Date   CHOLHDL 4.5 02/19/2019   CHOLHDL 3.9 09/17/2017   CHOLHDL 3.5 09/16/2016   No results found for: LDLDIRECT  Glucose: Glucose, Bld  Date Value Ref Range Status  02/19/2019 82 65 - 99 mg/dL Final    Comment:    .            Fasting reference interval .   09/17/2017 88 65 - 99 mg/dL Final    Comment:    .            Fasting reference interval .   09/16/2016 80 65 - 99 mg/dL Final    Patient Active Problem List   Diagnosis Date Noted  . Acanthosis nigricans 12/31/2014  . Essential (primary) hypertension 12/31/2014  . Dysmetabolic syndrome 41/32/4401  . Obesity 12/31/2014  . Perennial allergic rhinitis 12/31/2014  . Vitamin D deficiency 12/31/2014    Past Surgical History:  Procedure Laterality Date  . MOUTH SURGERY      Family History  Problem Relation Age of Onset  . Hypertension Mother   . Arthritis Mother   . Hypertension Father   . Diabetes Paternal Grandmother     Social History   Socioeconomic History  . Marital status: Single    Spouse name: Not on file  . Number of children: 0  . Years of education: Not on file  . Highest education level: Associate degree: academic program  Occupational History  . Occupation: Web designer   Tobacco Use  . Smoking status: Never Smoker  . Smokeless tobacco: Never Used  Vaping Use  . Vaping Use: Never used  Substance and Sexual Activity  . Alcohol use: Yes    Alcohol/week: 0.0 standard  drinks    Comment: Socially  . Drug use: No  . Sexual activity: Not Currently    Birth control/protection: Abstinence  Other Topics Concern  . Not on file  Social History Narrative   Lives with mother, father ( [place] ) and two sisters.   Social Determinants of Health   Financial Resource Strain:   . Difficulty of Paying Living Expenses: Not on file  Food Insecurity: No Food Insecurity  . Worried About Charity fundraiser in the Last Year:  Never true  . Ran Out of Food in the Last Year: Never true  Transportation Needs: No Transportation Needs  . Lack of Transportation (Medical): No  . Lack of Transportation (Non-Medical): No  Physical Activity: Insufficiently Active  . Days of Exercise per Week: 3 days  . Minutes of Exercise per Session: 30 min  Stress: No Stress Concern Present  . Feeling of Stress : Not at all  Social Connections: Moderately Isolated  . Frequency of Communication with Friends and Family: Twice a week  . Frequency of Social Gatherings with Friends and Family: Twice a week  . Attends Religious Services: More than 4 times per year  . Active Member of Clubs or Organizations: No  . Attends Archivist Meetings: Never  . Marital Status: Never married  Intimate Partner Violence: Not At Risk  . Fear of Current or Ex-Partner: No  . Emotionally Abused: No  . Physically Abused: No  . Sexually Abused: No     Current Outpatient Medications:  .  Cholecalciferol (VITAMIN D) 2000 UNITS tablet, Take 1 tablet by mouth daily., Disp: , Rfl:  .  fluticasone (FLONASE) 50 MCG/ACT nasal spray, Place 2 sprays into both nostrils daily., Disp: 16 g, Rfl: 2 .  loratadine (CLARITIN) 10 MG tablet, Take 1 tablet (10 mg total) by mouth daily., Disp: 100 tablet, Rfl: 2 .  montelukast (SINGULAIR) 10 MG tablet, TAKE 1 TABLET BY MOUTH EVERY DAY, Disp: 90 tablet, Rfl: 1 .  Multiple Vitamin tablet, Take 1 tablet by mouth daily., Disp: , Rfl:  .  hydrochlorothiazide (HYDRODIURIL) 12.5  MG tablet, Take 1 tablet (12.5 mg total) by mouth daily., Disp: 90 tablet, Rfl: 1  No Known Allergies   ROS  Constitutional: Negative for fever or weight change.  Respiratory: Negative for cough and shortness of breath.   Cardiovascular: Negative for chest pain or palpitations.  Gastrointestinal: Negative for abdominal pain, no bowel changes.  Musculoskeletal: Negative for gait problem or joint swelling.  Skin: Negative for rash.  Neurological: Negative for dizziness or headache.  No other specific complaints in a complete review of systems (except as listed in HPI above).  Objective  Vitals:   03/02/20 0813 03/02/20 0909  BP: 130/90 130/90  Pulse: 89   Resp: 16   Temp: 98.4 F (36.9 C)   TempSrc: Oral   SpO2: 99%   Weight: 210 lb 11.2 oz (95.6 kg)   Height: 5' 0.75" (1.543 m)     Body mass index is 40.14 kg/m.  Physical Exam  Constitutional: Patient appears well-developed and well-nourished. No distress.  HENT: Head: Normocephalic and atraumatic. Ears: B TMs ok, no erythema or effusion; Nose: Nose normal. Mouth/Throat: not done Eyes: Conjunctivae and EOM are normal. Pupils are equal, round, and reactive to light. No scleral icterus.  Neck: Normal range of motion. Neck supple. No JVD present. No thyromegaly present.  Cardiovascular: Normal rate, regular rhythm and normal heart sounds.  No murmur heard. No BLE edema. Pulmonary/Chest: Effort normal and breath sounds normal. No respiratory distress. Abdominal: Soft. Bowel sounds are normal, no distension. There is no tenderness. no masses Breast: no lumps or masses, no nipple discharge or rashes FEMALE GENITALIA:  Not done RECTAL: not done  Musculoskeletal: Normal range of motion, no joint effusions. No gross deformities Neurological: he is alert and oriented to person, place, and time. No cranial nerve deficit. Coordination, balance, strength, speech and gait are normal.  Skin: Skin is warm and dry. No rash noted. No  erythema.  Psychiatric: Patient has a normal mood and affect. behavior is normal. Judgment and thought content normal.  Fall Risk: Fall Risk  03/02/2020 09/02/2019 02/19/2019 11/11/2018 08/06/2018  Falls in the past year? 0 0 0 0 0  Number falls in past yr: 0 0 0 0 0  Injury with Fall? 0 0 0 0 0  Comment - - - - -  Risk for fall due to : - - - - -  Risk for fall due to: Comment - - - - -  Follow up - - - - -    Assessment & Plan  1. Essential (primary) hypertension  - COMPLETE METABOLIC PANEL WITH GFR - CBC with Differential - hydrochlorothiazide (HYDRODIURIL) 12.5 MG tablet; Take 1 tablet (12.5 mg total) by mouth daily.  Dispense: 90 tablet; Refill: 1  BP not at goal today, advised her to monitor and return sooner if bp still not below 130/90 to adjust medication   2. Dysmetabolic syndrome  - Hemoglobin A1c  3. Vitamin D deficiency  - VITAMIN D 25 Hydroxy (Vit-D Deficiency, Fractures)  4. Class 2 obesity due to excess calories with body mass index (BMI) of 39.0 to 39.9 in adult, unspecified whether serious comorbidity present  She is currently on Ozempic but will stop taking it when out of current rx   5. Well woman exam  Discussed high calcium diet, vitamin D supplementation and life style modifications to improve her health   6. Lipid screening  - Lipid panel  7. Perennial allergic rhinitis  She needs refills   8. Encounter for screening mammogram for malignant neoplasm of breast  - MM 3D SCREEN BREAST BILATERAL; Future  -USPSTF grade A and B recommendations reviewed with patient; age-appropriate recommendations, preventive care, screening tests, etc discussed and encouraged; healthy living encouraged; see AVS for patient education given to patient -Discussed importance of 150 minutes of physical activity weekly, eat two servings of fish weekly, eat one serving of tree nuts ( cashews, pistachios, pecans, almonds.Marland Kitchen) every other day, eat 6 servings of fruit/vegetables  daily and drink plenty of water and avoid sweet beverages.

## 2020-03-01 NOTE — Patient Instructions (Signed)
Preventive Care 40-40 Years Old, Female °Preventive care refers to visits with your health care provider and lifestyle choices that can promote health and wellness. This includes: °· A yearly physical exam. This may also be called an annual well check. °· Regular dental visits and eye exams. °· Immunizations. °· Screening for certain conditions. °· Healthy lifestyle choices, such as eating a healthy diet, getting regular exercise, not using drugs or products that contain nicotine and tobacco, and limiting alcohol use. °What can I expect for my preventive care visit? °Physical exam °Your health care provider will check your: °· Height and weight. This may be used to calculate body mass index (BMI), which tells if you are at a healthy weight. °· Heart rate and blood pressure. °· Skin for abnormal spots. °Counseling °Your health care provider may ask you questions about your: °· Alcohol, tobacco, and drug use. °· Emotional well-being. °· Home and relationship well-being. °· Sexual activity. °· Eating habits. °· Work and work environment. °· Method of birth control. °· Menstrual cycle. °· Pregnancy history. °What immunizations do I need? ° °Influenza (flu) vaccine °· This is recommended every year. °Tetanus, diphtheria, and pertussis (Tdap) vaccine °· You may need a Td booster every 10 years. °Varicella (chickenpox) vaccine °· You may need this if you have not been vaccinated. °Zoster (shingles) vaccine °· You may need this after age 60. °Measles, mumps, and rubella (MMR) vaccine °· You may need at least one dose of MMR if you were born in 1957 or later. You may also need a second dose. °Pneumococcal conjugate (PCV13) vaccine °· You may need this if you have certain conditions and were not previously vaccinated. °Pneumococcal polysaccharide (PPSV23) vaccine °· You may need one or two doses if you smoke cigarettes or if you have certain conditions. °Meningococcal conjugate (MenACWY) vaccine °· You may need this if you  have certain conditions. °Hepatitis A vaccine °· You may need this if you have certain conditions or if you travel or work in places where you may be exposed to hepatitis A. °Hepatitis B vaccine °· You may need this if you have certain conditions or if you travel or work in places where you may be exposed to hepatitis B. °Haemophilus influenzae type b (Hib) vaccine °· You may need this if you have certain conditions. °Human papillomavirus (HPV) vaccine °· If recommended by your health care provider, you may need three doses over 6 months. °You may receive vaccines as individual doses or as more than one vaccine together in one shot (combination vaccines). Talk with your health care provider about the risks and benefits of combination vaccines. °What tests do I need? °Blood tests °· Lipid and cholesterol levels. These may be checked every 5 years, or more frequently if you are over 50 years old. °· Hepatitis C test. °· Hepatitis B test. °Screening °· Lung cancer screening. You may have this screening every year starting at age 55 if you have a 30-pack-year history of smoking and currently smoke or have quit within the past 15 years. °· Colorectal cancer screening. All adults should have this screening starting at age 50 and continuing until age 75. Your health care provider may recommend screening at age 45 if you are at increased risk. You will have tests every 1-10 years, depending on your results and the type of screening test. °· Diabetes screening. This is done by checking your blood sugar (glucose) after you have not eaten for a while (fasting). You may have this   done every 1-3 years.  Mammogram. This may be done every 1-2 years. Talk with your health care provider about when you should start having regular mammograms. This may depend on whether you have a family history of breast cancer.  BRCA-related cancer screening. This may be done if you have a family history of breast, ovarian, tubal, or peritoneal  cancers.  Pelvic exam and Pap test. This may be done every 3 years starting at age 76. Starting at age 89, this may be done every 5 years if you have a Pap test in combination with an HPV test. Other tests  Sexually transmitted disease (STD) testing.  Bone density scan. This is done to screen for osteoporosis. You may have this scan if you are at high risk for osteoporosis. Follow these instructions at home: Eating and drinking  Eat a diet that includes fresh fruits and vegetables, whole grains, lean protein, and low-fat dairy.  Take vitamin and mineral supplements as recommended by your health care provider.  Do not drink alcohol if: ? Your health care provider tells you not to drink. ? You are pregnant, may be pregnant, or are planning to become pregnant.  If you drink alcohol: ? Limit how much you have to 0-1 drink a day. ? Be aware of how much alcohol is in your drink. In the [place] one drink equals one 12 oz bottle of beer (355 mL), one 5 oz glass of wine (148 mL), or one 1 oz glass of hard liquor (44 mL). Lifestyle  Take daily care of your teeth and gums.  Stay active. Exercise for at least 30 minutes on 5 or more days each week.  Do not use any products that contain nicotine or tobacco, such as cigarettes, e-cigarettes, and chewing tobacco. If you need help quitting, ask your health care provider.  If you are sexually active, practice safe sex. Use a condom or other form of birth control (contraception) in order to prevent pregnancy and STIs (sexually transmitted infections).  If told by your health care provider, take low-dose aspirin daily starting at age 37. What's next?  Visit your health care provider once a year for a well check visit.  Ask your health care provider how often you should have your eyes and teeth checked.  Stay up to date on all vaccines. This information is not intended to replace advice given to you by your health care provider. Make sure you  discuss any questions you have with your health care provider. Document Revised: 03/12/2018 Document Reviewed: 03/12/2018 Elsevier Patient Education  2020 [employer].

## 2020-03-02 ENCOUNTER — Encounter: Payer: Self-pay | Admitting: Family Medicine

## 2020-03-02 ENCOUNTER — Other Ambulatory Visit: Payer: Self-pay

## 2020-03-02 ENCOUNTER — Ambulatory Visit (INDEPENDENT_AMBULATORY_CARE_PROVIDER_SITE_OTHER): Payer: BC Managed Care – PPO | Admitting: Family Medicine

## 2020-03-02 VITALS — BP 130/90 | HR 89 | Temp 98.4°F | Resp 16 | Ht 60.75 in | Wt 210.7 lb

## 2020-03-02 DIAGNOSIS — Z01419 Encounter for gynecological examination (general) (routine) without abnormal findings: Secondary | ICD-10-CM | POA: Diagnosis not present

## 2020-03-02 DIAGNOSIS — E559 Vitamin D deficiency, unspecified: Secondary | ICD-10-CM | POA: Diagnosis not present

## 2020-03-02 DIAGNOSIS — Z1231 Encounter for screening mammogram for malignant neoplasm of breast: Secondary | ICD-10-CM

## 2020-03-02 DIAGNOSIS — E8881 Metabolic syndrome: Secondary | ICD-10-CM | POA: Diagnosis not present

## 2020-03-02 DIAGNOSIS — I10 Essential (primary) hypertension: Secondary | ICD-10-CM

## 2020-03-02 DIAGNOSIS — J3089 Other allergic rhinitis: Secondary | ICD-10-CM

## 2020-03-02 DIAGNOSIS — E6609 Other obesity due to excess calories: Secondary | ICD-10-CM

## 2020-03-02 DIAGNOSIS — Z6839 Body mass index (BMI) 39.0-39.9, adult: Secondary | ICD-10-CM

## 2020-03-02 DIAGNOSIS — Z13220 Encounter for screening for lipoid disorders: Secondary | ICD-10-CM

## 2020-03-02 MED ORDER — HYDROCHLOROTHIAZIDE 12.5 MG PO TABS: 12.5000 mg | ORAL_TABLET | Freq: Every day | ORAL | 1 refills | Status: DC

## 2020-03-02 MED ORDER — FLUTICASONE PROPIONATE 50 MCG/ACT NA SUSP
2.0000 | Freq: Every day | NASAL | 2 refills | Status: DC
Start: 2020-03-02 — End: 2020-05-28

## 2020-03-03 LAB — LIPID PANEL
Cholesterol: 164 mg/dL (ref <200)
HDL: 39 mg/dL — ABNORMAL LOW (ref >=50)
LDL Cholesterol (Calc): 105 mg/dL (calc) — ABNORMAL HIGH
Non-HDL Cholesterol (Calc): 125 mg/dL (calc) (ref <130)
Total CHOL/HDL Ratio: 4.2 (calc) (ref <5.0)
Triglycerides: 104 mg/dL (ref <150)

## 2020-03-03 LAB — COMPLETE METABOLIC PANEL WITH GFR
AG Ratio: 1.5 (calc) (ref 1.0–2.5)
ALT: 19 U/L (ref 6–29)
AST: 18 U/L (ref 10–30)
Albumin: 4.2 g/dL (ref 3.6–5.1)
Alkaline phosphatase (APISO): 44 U/L (ref 31–125)
BUN: 12 mg/dL (ref 7–25)
CO2: 28 mmol/L (ref 20–32)
Calcium: 9.5 mg/dL (ref 8.6–10.2)
Chloride: 102 mmol/L (ref 98–110)
Creat: 0.97 mg/dL (ref 0.50–1.10)
GFR, Est African American: 85 mL/min/{1.73_m2} (ref >=60)
GFR, Est Non African American: 73 mL/min/{1.73_m2} (ref >=60)
Globulin: 2.8 g/dL (calc) (ref 1.9–3.7)
Glucose, Bld: 84 mg/dL (ref 65–99)
Potassium: 3.8 mmol/L (ref 3.5–5.3)
Sodium: 138 mmol/L (ref 135–146)
Total Bilirubin: 0.4 mg/dL (ref 0.2–1.2)
Total Protein: 7 g/dL (ref 6.1–8.1)

## 2020-03-03 LAB — CBC WITH DIFFERENTIAL/PLATELET
Absolute Monocytes: 439 cells/uL (ref 200–950)
Basophils Absolute: 49 cells/uL (ref 0–200)
Basophils Relative: 0.8 %
Eosinophils Absolute: 98 cells/uL (ref 15–500)
Eosinophils Relative: 1.6 %
HCT: 41.1 % (ref 35.0–45.0)
Hemoglobin: 13.9 g/dL (ref 11.7–15.5)
Lymphs Abs: 2178 cells/uL (ref 850–3900)
MCH: 31.7 pg (ref 27.0–33.0)
MCHC: 33.8 g/dL (ref 32.0–36.0)
MCV: 93.8 fL (ref 80.0–100.0)
MPV: 10.1 fL (ref 7.5–12.5)
Monocytes Relative: 7.2 %
Neutro Abs: 3337 cells/uL (ref 1500–7800)
Neutrophils Relative %: 54.7 %
Platelets: 368 10*3/uL (ref 140–400)
RBC: 4.38 10*6/uL (ref 3.80–5.10)
RDW: 12.6 % (ref 11.0–15.0)
Total Lymphocyte: 35.7 %
WBC: 6.1 10*3/uL (ref 3.8–10.8)

## 2020-03-03 LAB — HEMOGLOBIN A1C
Hgb A1c MFr Bld: 5.1 % of total Hgb (ref <5.7)
Mean Plasma Glucose: 100 (calc)
eAG (mmol/L): 5.5 (calc)

## 2020-03-03 LAB — VITAMIN D 25 HYDROXY (VIT D DEFICIENCY, FRACTURES): Vit D, 25-Hydroxy: 45 ng/mL (ref 30–100)

## 2020-03-31 ENCOUNTER — Other Ambulatory Visit: Payer: Self-pay

## 2020-03-31 ENCOUNTER — Ambulatory Visit
Admission: RE | Admit: 2020-03-31 | Discharge: 2020-03-31 | Disposition: A | Discharge status: Home / Self Care | Payer: BC Managed Care – PPO | Source: Ambulatory Visit | Attending: Family Medicine | Admitting: Family Medicine

## 2020-03-31 DIAGNOSIS — Z1231 Encounter for screening mammogram for malignant neoplasm of breast: Secondary | ICD-10-CM | POA: Insufficient documentation

## 2020-05-11 ENCOUNTER — Encounter: Payer: Self-pay | Admitting: Family Medicine

## 2020-05-28 ENCOUNTER — Other Ambulatory Visit: Payer: Self-pay | Admitting: Family Medicine

## 2020-05-28 NOTE — Telephone Encounter (Signed)
Requested Prescriptions  Pending Prescriptions Disp Refills  . fluticasone (FLONASE) 50 MCG/ACT nasal spray [Pharmacy Med Name: FLUTICASONE PROP 50 MCG SPRAY] 48 mL 0    Sig: SPRAY 2 SPRAYS INTO EACH NOSTRIL EVERY DAY     Ear, Nose, and Throat: Nasal Preparations - Corticosteroids Passed - 05/28/2020 10:27 AM      Passed - Valid encounter within last 12 months    Recent Outpatient Visits          2 months ago Vitamin D deficiency   [facility] Alba Cory, MD   8 months ago Essential (primary) hypertension   [facility] Alba Cory, MD   1 year ago Essential (primary) hypertension   [facility] Alba Cory, MD   1 year ago Essential (primary) hypertension   [facility] Alba Cory, MD   1 year ago Essential (primary) hypertension   [facility] Alba Cory, MD      Future Appointments            In 3 months Carlynn Purl, Danna Hefty, MD [facility], [facility]

## 2020-06-13 ENCOUNTER — Encounter: Payer: Self-pay | Admitting: Family Medicine

## 2020-08-12 ENCOUNTER — Other Ambulatory Visit: Payer: Self-pay | Admitting: Family Medicine

## 2020-08-12 DIAGNOSIS — J3089 Other allergic rhinitis: Secondary | ICD-10-CM

## 2020-08-26 ENCOUNTER — Other Ambulatory Visit: Payer: Self-pay | Admitting: Family Medicine

## 2020-09-01 NOTE — Progress Notes (Signed)
Name: Kaitlin Schroeder   MRN: [id number]    DOB: Aug 31, 1980   Date:09/04/2020       Progress Note  Subjective  Chief Complaint  Follow Up  HPI  HTN: patient is taking medication and denies side effects, no chest pain,edema,palpitation or SOB.She is now off Maxzide and is on hctz. DBP improved today. Continue current regiment and advised her to monitor bp at home   Perennial Allergic Rhinitis: takingLoratadineand singulair daily and nasal spray. She always has watery eyes. She has tried otc medication, we can try rx medication   Adult Obesity: insurance stopped paying for Qsymia, her original weight was 218 lbs on 04/2013. She was down to 199 lbs in December 2016but medication had to be switched to [facility] around Feb 2017 after a gap on medication and she is up to 205.6lbsInsurance stopped paying for Contrave because she was not losing weight. She was given Victoza Dec 2017 id not lose much weight, started on Ozempic 09/2016 at 213 lbs. Weight back in 02/2020 ws 210 lbs, she decided to stop Ozempic mid Nov, she gained some weight , she was 219 lbs beginning of Feb but joined [employer] with her sisters and mother and is doing well.   Metabolic Syndrome: hgbA1C was5.1% She denies polyphagia, polydipsia or polyuria.She is off  Ozempic, but eating healthy.   Patient Active Problem List   Diagnosis Date Noted  . Acanthosis nigricans 12/31/2014  . Essential (primary) hypertension 12/31/2014  . Dysmetabolic syndrome 12/31/2014  . Obesity 12/31/2014  . Perennial allergic rhinitis 12/31/2014  . Vitamin D deficiency 12/31/2014    Past Surgical History:  Procedure Laterality Date  . MOUTH SURGERY      Family History  Problem Relation Age of Onset  . Hypertension Mother   . Arthritis Mother   . Hypertension Father   . Diabetes Paternal Grandmother   . Breast cancer Maternal Aunt 60  . Breast cancer Other 64       maunt    Social History   Tobacco Use  . Smoking  status: Never Smoker  . Smokeless tobacco: Never Used  Substance Use Topics  . Alcohol use: Yes    Alcohol/week: 0.0 standard drinks    Comment: Socially     Current Outpatient Medications:  .  Cholecalciferol (VITAMIN D) 2000 UNITS tablet, Take 1 tablet by mouth daily., Disp: , Rfl:  .  fluticasone (FLONASE) 50 MCG/ACT nasal spray, SPRAY 2 SPRAYS INTO EACH NOSTRIL EVERY DAY, Disp: 48 mL, Rfl: 0 .  hydrochlorothiazide (HYDRODIURIL) 12.5 MG tablet, Take 1 tablet (12.5 mg total) by mouth daily., Disp: 90 tablet, Rfl: 1 .  loratadine (CLARITIN) 10 MG tablet, Take 1 tablet (10 mg total) by mouth daily., Disp: 100 tablet, Rfl: 2 .  montelukast (SINGULAIR) 10 MG tablet, TAKE 1 TABLET BY MOUTH EVERY DAY, Disp: 90 tablet, Rfl: 1 .  Multiple Vitamin tablet, Take 1 tablet by mouth daily., Disp: , Rfl:   No Known Allergies  I personally reviewed active problem list, medication list, allergies, family history, social history, health maintenance with the patient/caregiver today.   ROS  Constitutional: Negative for fever or weight change.  Respiratory: Negative for cough and shortness of breath.   Cardiovascular: Negative for chest pain or palpitations.  Gastrointestinal: Negative for abdominal pain, no bowel changes.  Musculoskeletal: Negative for gait problem or joint swelling.  Skin: Negative for rash.  Neurological: Negative for dizziness or headache.  No other specific complaints in a complete review of systems (  except as listed in HPI above).  Objective  Vitals:   09/04/20 0731  BP: 132/86  Pulse: 89  Resp: 16  Temp: 99.2 F (37.3 C)  TempSrc: Oral  SpO2: 99%  Weight: 215 lb 14.4 oz (97.9 kg)  Height: 5\' 1"  (1.549 m)    Body mass index is 40.79 kg/m.  Physical Exam  Constitutional: Patient appears well-developed and well-nourished. Obese No distress.  HEENT: head atraumatic, normocephalic, pupils equal and reactive to light,  neck supple Cardiovascular: Normal rate,  regular rhythm and normal heart sounds.  No murmur heard. No BLE edema. Pulmonary/Chest: Effort normal and breath sounds normal. No respiratory distress. Abdominal: Soft.  There is no tenderness. Psychiatric: Patient has a normal mood and affect. behavior is normal. Judgment and thought content normal.  PHQ2/9: Depression screen [facility] 2/9 09/04/2020 03/02/2020 09/02/2019 02/19/2019 11/11/2018  Decreased Interest 0 0 0 0 0  Down, Depressed, Hopeless 0 0 0 0 0  PHQ - 2 Score 0 0 0 0 0  Altered sleeping - 0 0 0 0  Tired, decreased energy - 0 0 0 0  Change in appetite - 0 0 0 0  Feeling bad or failure about yourself  - 0 0 0 0  Trouble concentrating - 0 0 0 0  Moving slowly or fidgety/restless - 0 0 0 0  Suicidal thoughts - 0 0 0 0  PHQ-9 Score - 0 0 0 0  Difficult doing work/chores - - Not difficult at all - Not difficult at all    phq 9 is negative   Fall Risk: Fall Risk  09/04/2020 03/02/2020 09/02/2019 02/19/2019 11/11/2018  Falls in the past year? 0 0 0 0 0  Number falls in past yr: 0 0 0 0 0  Injury with Fall? 0 0 0 0 0  Comment - - - - -  Risk for fall due to : - - - - -  Risk for fall due to: Comment - - - - -  Follow up - - - - -    Functional Status Survey: Is the patient deaf or have difficulty hearing?: No Does the patient have difficulty seeing, even when wearing glasses/contacts?: No Does the patient have difficulty concentrating, remembering, or making decisions?: No Does the patient have difficulty walking or climbing stairs?: No Does the patient have difficulty dressing or bathing?: No Does the patient have difficulty doing errands alone such as visiting a doctor's office or shopping?: No    Assessment & Plan  1. Essential (primary) hypertension  - hydrochlorothiazide (HYDRODIURIL) 12.5 MG tablet; Take 1 tablet (12.5 mg total) by mouth daily.  Dispense: 90 tablet; Refill: 1  2. Dysmetabolic syndrome   3. Class 2 obesity due to excess calories with body mass index  (BMI) of 39.0 to 39.9 in adult, unspecified whether serious comorbidity present   4. Perennial allergic rhinitis  - Olopatadine HCl 0.2 % SOLN; Apply 1 drop to eye daily.  Dispense: 2.5 mL; Refill: 5  5. Vitamin D deficiency  Continue supplementation

## 2020-09-04 ENCOUNTER — Encounter: Payer: Self-pay | Admitting: Family Medicine

## 2020-09-04 ENCOUNTER — Other Ambulatory Visit: Payer: Self-pay

## 2020-09-04 ENCOUNTER — Ambulatory Visit: Payer: BC Managed Care – PPO | Admitting: Family Medicine

## 2020-09-04 VITALS — BP 132/86 | HR 89 | Temp 99.2°F | Resp 16 | Ht 61.0 in | Wt 215.9 lb

## 2020-09-04 DIAGNOSIS — I10 Essential (primary) hypertension: Secondary | ICD-10-CM

## 2020-09-04 DIAGNOSIS — Z6839 Body mass index (BMI) 39.0-39.9, adult: Secondary | ICD-10-CM

## 2020-09-04 DIAGNOSIS — E8881 Metabolic syndrome: Secondary | ICD-10-CM

## 2020-09-04 DIAGNOSIS — E6609 Other obesity due to excess calories: Secondary | ICD-10-CM

## 2020-09-04 DIAGNOSIS — J3089 Other allergic rhinitis: Secondary | ICD-10-CM | POA: Diagnosis not present

## 2020-09-04 DIAGNOSIS — E559 Vitamin D deficiency, unspecified: Secondary | ICD-10-CM

## 2020-09-04 MED ORDER — OLOPATADINE HCL 0.2 % OP SOLN: 1.0000 [drp] | Freq: Every day | OPHTHALMIC | 5 refills | Status: DC

## 2020-09-04 MED ORDER — HYDROCHLOROTHIAZIDE 12.5 MG PO TABS: 12.5000 mg | ORAL_TABLET | Freq: Every day | ORAL | 1 refills | Status: DC

## 2021-02-14 ENCOUNTER — Other Ambulatory Visit: Payer: Self-pay | Admitting: Family Medicine

## 2021-02-14 DIAGNOSIS — J3089 Other allergic rhinitis: Secondary | ICD-10-CM

## 2021-02-14 NOTE — Telephone Encounter (Signed)
Requested Prescriptions  Pending Prescriptions Disp Refills  . montelukast (SINGULAIR) 10 MG tablet [Pharmacy Med Name: MONTELUKAST SOD 10 MG TABLET] 90 tablet 1    Sig: TAKE 1 TABLET BY MOUTH EVERY DAY     Pulmonology:  Leukotriene Inhibitors Passed - 02/14/2021  1:34 AM      Passed - Valid encounter within last 12 months    Recent Outpatient Visits          5 months ago Essential (primary) hypertension   [facility] Al[facility], MD   11 months ago Vitamin D deficiency   [facility] Alba Cory, MD   1 year ago Essential (primary) hypertension   [facility] Alba Cory, MD   1 year ago Essential (primary) hypertension   [facility] Alba Cory, MD   2 years ago Essential (primary) hypertension   [facility] Alba Cory, MD      Future Appointments            In 2 weeks Alba Cory, MD [facility], [facility]

## 2021-03-01 NOTE — Progress Notes (Signed)
Name: Kaitlin Schroeder   MRN: [id number]    DOB: [birth year]   Date:03/05/2021       Progress Note  Subjective  Chief Complaint  Follow Up  HPI  HTN: patient is taking HCTZ, and denies side effects. No complaints of chest pain, edema, palpitation or SOB. She has been checking her blood pressure at home, 120's/60's.  Today it is 124/78.   Perennial Allergic Rhinitis: taking Loratadine  and singulair and nasal spray daily. She has watery eyes daily.  Uses Olopatadine occasionally.   Adult Obesity: insurance stopped paying for Qsymia, her original weight was 218 lbs on 04/2013.  She was down to 199 lbs in December 2016 but medication had to be switched to [facility] around Feb 2017 after a gap on medication and she is up to [employer] stopped paying for Contrave because she was not losing weight. She was given Victoza Dec 2017 id not lose much weight, started on Ozempic 09/2016 at 213 lbs. Weight back in 02/2020 ws 210 lbs, she decided to stop Ozempic mid Nov, she gained some weight , she was 219 lbs beginning of Feb but joined [employer].  She has lost three more pounds since last visit.  Subscription to [employer] Watchers is about to be discontinued and she will continue on her own. She tries to walk a mile a day, at minimum 3 days a week.   Metabolic Syndrome: hgbA1C was 4.8% in  02/2020.  She denies polyphagia, polydipsia or polyuria. She is still doing [employer] and getting physical activity.  Will get labs today  Chest Heaviness: She says after taking OTC Metabolism Gummy Rings for about a week she started having chest heaviness, shortness of breath, dizzy and nauseated.  Symptoms resolved three days after stopping the OTC Metabolism supplement.  Left eye: She says Saturday she was weed eating and something flew into her eye. She rinsed her eye out with water.  redness noted in the conjunctiva. [place] to eye doctor for eval.  Patient Active Problem List   Diagnosis Date Noted    Acanthosis nigricans 12/31/2014   Essential (primary) hypertension 12/31/2014   Dysmetabolic syndrome 12/31/2014   Obesity 12/31/2014   Perennial allergic rhinitis 12/31/2014   Vitamin D deficiency 12/31/2014    Past Surgical History:  Procedure Laterality Date   MOUTH SURGERY      Family History  Problem Relation Age of Onset   Hypertension Mother    Arthritis Mother    Hypertension Father    Diabetes Paternal Grandmother    Breast cancer Maternal Aunt 41   Breast cancer Other 52       maunt    Social History   Tobacco Use   Smoking status: Never   Smokeless tobacco: Never  Substance Use Topics   Alcohol use: Yes    Alcohol/week: 0.0 standard drinks    Comment: Socially     Current Outpatient Medications:    Cholecalciferol (VITAMIN D) 2000 UNITS tablet, Take 1 tablet by mouth daily., Disp: , Rfl:    fluticasone (FLONASE) 50 MCG/ACT nasal spray, SPRAY 2 SPRAYS INTO EACH NOSTRIL EVERY DAY, Disp: 48 mL, Rfl: 0   hydrochlorothiazide (HYDRODIURIL) 12.5 MG tablet, Take 1 tablet (12.5 mg total) by mouth daily., Disp: 90 tablet, Rfl: 1   loratadine (CLARITIN) 10 MG tablet, Take 1 tablet (10 mg total) by mouth daily., Disp: 100 tablet, Rfl: 2   montelukast (SINGULAIR) 10 MG tablet, TAKE 1 TABLET BY MOUTH EVERY DAY, Disp: 90  tablet, Rfl: 1   Multiple Vitamin tablet, Take 1 tablet by mouth daily., Disp: , Rfl:    Olopatadine HCl 0.2 % SOLN, Apply 1 drop to eye daily., Disp: 2.5 mL, Rfl: 5  No Known Allergies  I personally reviewed active problem list, medication list, allergies, family history, social history, health maintenance, notes from last encounter with the patient/caregiver today.   ROS  Constitutional: Negative for fever, positive weight change.  Respiratory: Negative for cough and shortness of breath.   Cardiovascular: Positive for chest pain, negative for palpitations.  Gastrointestinal: Negative for abdominal pain, no bowel changes.  Musculoskeletal:  Negative for gait problem or joint swelling.  Skin: Negative for rash.  Neurological: Negative for dizziness or headache.  No other specific complaints in a complete review of systems (except as listed in HPI above).   Objective  Vitals:   03/05/21 0756  BP: 124/78  Pulse: 98  Resp: 16  Temp: 98.8 F (37.1 C)  TempSrc: Oral  SpO2: 98%  Weight: 213 lb (96.6 kg)  Height: 5\' 1"  (1.549 m)    Body mass index is 40.25 kg/m.  Physical Exam Constitutional: Patient appears well-developed and well-nourished. Obese No distress.  HEENT: head atraumatic, normocephalic, pupils equal and reactive to light, left eye redness,  neck supple Cardiovascular: Normal rate, regular rhythm and normal heart sounds.  No murmur heard. No BLE edema. Pulmonary/Chest: Effort normal and breath sounds normal. No respiratory distress. Abdominal: Soft.  There is no tenderness. Psychiatric: Patient has a normal mood and affect. behavior is normal. Judgment and thought content normal.     PHQ2/9: Depression screen [facility] 2/9 03/05/2021 09/04/2020 03/02/2020 09/02/2019 02/19/2019  Decreased Interest 0 0 0 0 0  Down, Depressed, Hopeless 0 0 0 0 0  PHQ - 2 Score 0 0 0 0 0  Altered sleeping - - 0 0 0  Tired, decreased energy - - 0 0 0  Change in appetite - - 0 0 0  Feeling bad or failure about yourself  - - 0 0 0  Trouble concentrating - - 0 0 0  Moving slowly or fidgety/restless - - 0 0 0  Suicidal thoughts - - 0 0 0  PHQ-9 Score - - 0 0 0  Difficult doing work/chores - - - Not difficult at all -    phq 9 is negative   Fall Risk: Fall Risk  03/05/2021 09/04/2020 03/02/2020 09/02/2019 02/19/2019  Falls in the past year? 0 0 0 0 0  Number falls in past yr: 0 0 0 0 0  Injury with Fall? 0 0 0 0 0  Comment - - - - -  Risk for fall due to : - - - - -  Risk for fall due to: Comment - - - - -  Follow up Falls evaluation completed - - - -     Assessment & Plan  1. Essential (primary) hypertension  -  hydrochlorothiazide (HYDRODIURIL) 12.5 MG tablet; Take 1 tablet (12.5 mg total) by mouth daily.  Dispense: 90 tablet; Refill: 1 - COMPLETE METABOLIC PANEL WITH GFR - CBC w/Diff/Platelet  2. Lipid screening  - Lipid Profile  3. Perennial allergic rhinitis   4. Morbid obesity with body mass index (BMI) of 40.0 to 44.9 in adult (HCC)  - HgB A1c  5. Metabolic syndrome   6. Redness of left eye  Advised to go to eye doctor     04/21/2019, NP ,acting as a scribe for Antonietta Jewel, MD.,have  documented all relevant documentation on the behalf of Ruel Favors, MD,as directed by  Ruel Favors, MD while in the presence of Ruel Favors, MD.

## 2021-03-05 ENCOUNTER — Other Ambulatory Visit: Payer: Self-pay

## 2021-03-05 ENCOUNTER — Encounter: Payer: Self-pay | Admitting: Family Medicine

## 2021-03-05 ENCOUNTER — Ambulatory Visit: Payer: BC Managed Care – PPO | Admitting: Family Medicine

## 2021-03-05 ENCOUNTER — Telehealth: Payer: Self-pay

## 2021-03-05 VITALS — BP 124/78 | HR 98 | Temp 98.8°F | Resp 16 | Ht 61.0 in | Wt 213.0 lb

## 2021-03-05 DIAGNOSIS — I10 Essential (primary) hypertension: Secondary | ICD-10-CM

## 2021-03-05 DIAGNOSIS — Z1231 Encounter for screening mammogram for malignant neoplasm of breast: Secondary | ICD-10-CM

## 2021-03-05 DIAGNOSIS — Z13220 Encounter for screening for lipoid disorders: Secondary | ICD-10-CM | POA: Diagnosis not present

## 2021-03-05 DIAGNOSIS — J3089 Other allergic rhinitis: Secondary | ICD-10-CM

## 2021-03-05 DIAGNOSIS — H5789 Other specified disorders of eye and adnexa: Secondary | ICD-10-CM

## 2021-03-05 DIAGNOSIS — E8881 Metabolic syndrome: Secondary | ICD-10-CM

## 2021-03-05 DIAGNOSIS — Z6841 Body Mass Index (BMI) 40.0 and over, adult: Secondary | ICD-10-CM

## 2021-03-05 MED ORDER — HYDROCHLOROTHIAZIDE 12.5 MG PO TABS: 12.5000 mg | ORAL_TABLET | Freq: Every day | ORAL | 1 refills | Status: DC

## 2021-03-05 NOTE — Telephone Encounter (Signed)
Pt is due for her mammogram in September and would like for you to place the order so she can make her appt

## 2021-03-06 LAB — CBC WITH DIFFERENTIAL/PLATELET
Absolute Monocytes: 381 cells/uL (ref 200–950)
Basophils Absolute: 50 cells/uL (ref 0–200)
Basophils Relative: 0.9 %
Eosinophils Absolute: 230 cells/uL (ref 15–500)
Eosinophils Relative: 4.1 %
HCT: 41.2 % (ref 35.0–45.0)
Hemoglobin: 14.1 g/dL (ref 11.7–15.5)
Lymphs Abs: 2341 cells/uL (ref 850–3900)
MCH: 31.7 pg (ref 27.0–33.0)
MCHC: 34.2 g/dL (ref 32.0–36.0)
MCV: 92.6 fL (ref 80.0–100.0)
MPV: 10.2 fL (ref 7.5–12.5)
Monocytes Relative: 6.8 %
Neutro Abs: 2598 cells/uL (ref 1500–7800)
Neutrophils Relative %: 46.4 %
Platelets: 361 10*3/uL (ref 140–400)
RBC: 4.45 10*6/uL (ref 3.80–5.10)
RDW: 12.7 % (ref 11.0–15.0)
Total Lymphocyte: 41.8 %
WBC: 5.6 10*3/uL (ref 3.8–10.8)

## 2021-03-06 LAB — COMPLETE METABOLIC PANEL WITH GFR
AG Ratio: 1.6 (calc) (ref 1.0–2.5)
ALT: 20 U/L (ref 6–29)
AST: 18 U/L (ref 10–30)
Albumin: 4.3 g/dL (ref 3.6–5.1)
Alkaline phosphatase (APISO): 51 U/L (ref 31–125)
BUN: 15 mg/dL (ref 7–25)
CO2: 25 mmol/L (ref 20–32)
Calcium: 9.4 mg/dL (ref 8.6–10.2)
Chloride: 105 mmol/L (ref 98–110)
Creat: 0.87 mg/dL (ref 0.50–0.99)
Globulin: 2.7 g/dL (calc) (ref 1.9–3.7)
Glucose, Bld: 81 mg/dL (ref 65–99)
Potassium: 3.8 mmol/L (ref 3.5–5.3)
Sodium: 140 mmol/L (ref 135–146)
Total Bilirubin: 0.2 mg/dL (ref 0.2–1.2)
Total Protein: 7 g/dL (ref 6.1–8.1)
eGFR: 86 mL/min/{1.73_m2} (ref >=60)

## 2021-03-06 LAB — HEMOGLOBIN A1C
Hgb A1c MFr Bld: 5.3 % of total Hgb (ref <5.7)
Mean Plasma Glucose: 105 mg/dL
eAG (mmol/L): 5.8 mmol/L

## 2021-03-06 LAB — LIPID PANEL
Cholesterol: 172 mg/dL (ref <200)
HDL: 46 mg/dL — ABNORMAL LOW (ref >=50)
LDL Cholesterol (Calc): 104 mg/dL (calc) — ABNORMAL HIGH
Non-HDL Cholesterol (Calc): 126 mg/dL (calc) (ref <130)
Total CHOL/HDL Ratio: 3.7 (calc) (ref <5.0)
Triglycerides: 122 mg/dL (ref <150)

## 2021-04-02 ENCOUNTER — Ambulatory Visit
Admission: RE | Admit: 2021-04-02 | Discharge: 2021-04-02 | Disposition: A | Discharge status: Home / Self Care | Payer: BC Managed Care – PPO | Source: Ambulatory Visit | Attending: Family Medicine | Admitting: Family Medicine

## 2021-04-02 ENCOUNTER — Other Ambulatory Visit: Payer: Self-pay

## 2021-04-02 DIAGNOSIS — Z1231 Encounter for screening mammogram for malignant neoplasm of breast: Secondary | ICD-10-CM | POA: Diagnosis not present

## 2021-04-26 ENCOUNTER — Encounter: Payer: Self-pay | Admitting: Family Medicine

## 2021-07-12 ENCOUNTER — Encounter: Payer: Self-pay | Admitting: Family Medicine

## 2021-08-12 ENCOUNTER — Other Ambulatory Visit: Payer: Self-pay | Admitting: Family Medicine

## 2021-08-12 DIAGNOSIS — J3089 Other allergic rhinitis: Secondary | ICD-10-CM

## 2021-08-12 NOTE — Telephone Encounter (Signed)
Requested Prescriptions  Pending Prescriptions Disp Refills   montelukast (SINGULAIR) 10 MG tablet [Pharmacy Med Name: MONTELUKAST SOD 10 MG TABLET] 90 tablet 1    Sig: TAKE 1 TABLET BY MOUTH EVERY DAY     Pulmonology:  Leukotriene Inhibitors Passed - 08/12/2021 10:28 AM      Passed - Valid encounter within last 12 months    Recent Outpatient Visits          5 months ago Essential (primary) hypertension   [facility] Steele Sizer, MD   11 months ago Essential (primary) hypertension   [facility] Steele Sizer, MD   1 year ago Vitamin D deficiency   [facility] Steele Sizer, MD   1 year ago Essential (primary) hypertension   [facility] Steele Sizer, MD   2 years ago Essential (primary) hypertension   [facility] Steele Sizer, MD      Future Appointments            In 3 weeks Steele Sizer, MD [facility] [place]   In 1 month Steele Sizer, MD [facility]

## 2021-09-04 NOTE — Progress Notes (Signed)
Name: Kaitlin Schroeder   MRN: [id number]    DOB: May 03, 1981   Date:09/05/2021       Progress Note  Subjective  Chief Complaint  Follow Up  HPI  HTN: patient is taking HCTZ, and denies side effects. No complaints of chest pain, edema, palpitation or SOB. She has been checking her blood pressure at home, 120's/60's.  Today it is 124/78.   Perennial Allergic Rhinitis: taking Loratadine  and singulair and nasal spray but not daily, she will resume using it daily She has watery eyes daily.  Uses Olopatadine occasionally. She states recently noticing more nasal congestion, watery eyes and ear fullness since pollen count has gone up.    Adult Obesity: insurance stopped paying for Qsymia, her original weight was 218 lbs on 04/2013.  She was down to 199 lbs in December 2016 but medication had to be switched to [facility] around Feb 2017 after a gap on medication and she is up to [employer] stopped paying for Contrave because she was not losing weight. She was given Victoza Dec 2017 but she did not lose much weight, started on Ozempic 09/2016 at 213 lbs. Weight back in 02/2020 ws 210 lbs, she decided to stop Ozempic mid Nov, she gained some weight , she was 219 lbs beginning of Feb 22 when she joined [employer].  Weight today is 218 lbs , she is happy with Weight [employer]chers, trying to mindful of her diet and also increase physical activity.  Metabolic Syndrome: hgbA1C was 5.2% in  02/2021.  She denies polyphagia, polydipsia or polyuria. She is still doing [employer]chers and getting physical activity.    Patient Active Problem List   Diagnosis Date Noted   Acanthosis nigricans 12/31/2014   Essential (primary) hypertension 12/31/2014   Dysmetabolic syndrome 12/31/2014   Obesity 12/31/2014   Perennial allergic rhinitis 12/31/2014   Vitamin D deficiency 12/31/2014    Past Surgical History:  Procedure Laterality Date   MOUTH SURGERY      Family History  Problem Relation Age of Onset    Hypertension Mother    Arthritis Mother    Hypertension Father    Diabetes Paternal Grandmother    Breast cancer Maternal Aunt 88   Breast cancer Other 11       maunt    Social History   Tobacco Use   Smoking status: Never   Smokeless tobacco: Never  Substance Use Topics   Alcohol use: Yes    Alcohol/week: 0.0 standard drinks    Comment: Socially     Current Outpatient Medications:    Cholecalciferol (VITAMIN D) 2000 UNITS tablet, Take 1 tablet by mouth daily., Disp: , Rfl:    fluticasone (FLONASE) 50 MCG/ACT nasal spray, SPRAY 2 SPRAYS INTO EACH NOSTRIL EVERY DAY, Disp: 48 mL, Rfl: 0   hydrochlorothiazide (HYDRODIURIL) 12.5 MG tablet, Take 1 tablet (12.5 mg total) by mouth daily., Disp: 90 tablet, Rfl: 1   loratadine (CLARITIN) 10 MG tablet, Take 1 tablet (10 mg total) by mouth daily., Disp: 100 tablet, Rfl: 2   montelukast (SINGULAIR) 10 MG tablet, TAKE 1 TABLET BY MOUTH EVERY DAY, Disp: 90 tablet, Rfl: 1   Multiple Vitamin tablet, Take 1 tablet by mouth daily., Disp: , Rfl:    Olopatadine HCl 0.2 % SOLN, Apply 1 drop to eye daily., Disp: 2.5 mL, Rfl: 5  No Known Allergies  I personally reviewed active problem list, medication list, allergies, family history, social history, health maintenance with the patient/caregiver today.   ROS  Constitutional: Negative for fever or weight change.  Respiratory: Negative for cough and shortness of breath.   Cardiovascular: Negative for chest pain or palpitations.  Gastrointestinal: Negative for abdominal pain, no bowel changes.  Musculoskeletal: Negative for gait problem or joint swelling.  Skin: Negative for rash.  Neurological: Negative for dizziness or headache.  No other specific complaints in a complete review of systems (except as listed in HPI above).   Objective  Vitals:   09/05/21 0738  BP: 136/84  Pulse: 90  Resp: 16  SpO2: 98%  Weight: 218 lb (98.9 kg)  Height: 5\' 1"  (1.549 m)    Body mass index is 41.19  kg/m.  Physical Exam  Constitutional: Patient appears well-developed and well-nourished. Obese  No distress.  HEENT: head atraumatic, normocephalic, pupils equal and reactive to light,  neck supple Cardiovascular: Normal rate, regular rhythm and normal heart sounds.  No murmur heard. No BLE edema. Pulmonary/Chest: Effort normal and breath sounds normal. No respiratory distress. Abdominal: Soft.  There is no tenderness. Psychiatric: Patient has a normal mood and affect. behavior is normal. Judgment and thought content normal.    PHQ2/9: Depression screen [facility] 2/9 09/05/2021 03/05/2021 09/04/2020 03/02/2020 09/02/2019  Decreased Interest 0 0 0 0 0  Down, Depressed, Hopeless 0 0 0 0 0  PHQ - 2 Score 0 0 0 0 0  Altered sleeping 0 - - 0 0  Tired, decreased energy 0 - - 0 0  Change in appetite 0 - - 0 0  Feeling bad or failure about yourself  0 - - 0 0  Trouble concentrating 0 - - 0 0  Moving slowly or fidgety/restless 0 - - 0 0  Suicidal thoughts 0 - - 0 0  PHQ-9 Score 0 - - 0 0  Difficult doing work/chores - - - - Not difficult at all  Some recent data might be hidden    phq 9 is negative   Fall Risk: Fall Risk  09/05/2021 03/05/2021 09/04/2020 03/02/2020 09/02/2019  Falls in the past year? 0 0 0 0 0  Number falls in past yr: 0 0 0 0 0  Injury with Fall? 0 0 0 0 0  Comment - - - - -  Risk for fall due to : No Fall Risks - - - -  Risk for fall due to: Comment - - - - -  Follow up Falls prevention discussed Falls evaluation completed - - -      Functional Status Survey: Is the patient deaf or have difficulty hearing?: No Does the patient have difficulty seeing, even when wearing glasses/contacts?: No Does the patient have difficulty concentrating, remembering, or making decisions?: No Does the patient have difficulty walking or climbing stairs?: No Does the patient have difficulty dressing or bathing?: No Does the patient have difficulty doing errands alone such as visiting a  doctor's office or shopping?: No    Assessment & Plan  1. Morbid obesity with body mass index (BMI) of 40.0 to 44.9 in adult [facility])  Discussed with the patient the risk posed by an increased BMI. Discussed importance of portion control, calorie counting and at least 150 minutes of physical activity weekly. Avoid sweet beverages and drink more water. Eat at least 6 servings of fruit and vegetables daily    2. Essential (primary) hypertension  - hydrochlorothiazide (HYDRODIURIL) 12.5 MG tablet; Take 1 tablet (12.5 mg total) by mouth daily.  Dispense: 90 tablet; Refill: 1  3. Perennial allergic rhinitis   4. Metabolic  syndrome

## 2021-09-05 ENCOUNTER — Other Ambulatory Visit: Payer: Self-pay

## 2021-09-05 ENCOUNTER — Encounter: Payer: Self-pay | Admitting: Family Medicine

## 2021-09-05 ENCOUNTER — Ambulatory Visit: Payer: BC Managed Care – PPO | Admitting: Family Medicine

## 2021-09-05 VITALS — BP 136/84 | HR 90 | Resp 16 | Ht 61.0 in | Wt 218.0 lb

## 2021-09-05 DIAGNOSIS — I10 Essential (primary) hypertension: Secondary | ICD-10-CM

## 2021-09-05 DIAGNOSIS — E8881 Metabolic syndrome: Secondary | ICD-10-CM | POA: Diagnosis not present

## 2021-09-05 DIAGNOSIS — Z6841 Body Mass Index (BMI) 40.0 and over, adult: Secondary | ICD-10-CM

## 2021-09-05 DIAGNOSIS — J3089 Other allergic rhinitis: Secondary | ICD-10-CM

## 2021-09-05 MED ORDER — HYDROCHLOROTHIAZIDE 12.5 MG PO TABS: 12.5000 mg | ORAL_TABLET | Freq: Every day | ORAL | 1 refills | Status: DC

## 2021-09-17 NOTE — Patient Instructions (Signed)

## 2021-09-17 NOTE — Progress Notes (Signed)
Name: Kaitlin Schroeder   MRN: [id number]    DOB: November 04, 1980   Date:09/18/2021       Progress Note  Subjective  Chief Complaint  Annual Exam  HPI  Patient presents for annual CPE.  Diet: balanced diet, packs her lunch  Exercise:  [employer] classes with her sisters and also walking -  discussed 150 minutes per week   [place] Office Visit from 03/05/2021 in [facility]  AUDIT-C Score 0      Depression: Phq 9 is  negative Depression screen [facility] 2/9 09/18/2021 09/05/2021 03/05/2021 09/04/2020 03/02/2020  Decreased Interest 0 0 0 0 0  Down, Depressed, Hopeless 0 0 0 0 0  PHQ - 2 Score 0 0 0 0 0  Altered sleeping 0 0 - - 0  Tired, decreased energy 0 0 - - 0  Change in appetite 0 0 - - 0  Feeling bad or failure about yourself  0 0 - - 0  Trouble concentrating 0 0 - - 0  Moving slowly or fidgety/restless 0 0 - - 0  Suicidal thoughts 0 0 - - 0  PHQ-9 Score 0 0 - - 0  Difficult doing work/chores - - - - -  Some recent data might be hidden   Hypertension: BP Readings from Last 3 Encounters:  09/18/21 134/80  09/05/21 136/84  03/05/21 124/78   Obesity: Wt Readings from Last 3 Encounters:  09/18/21 219 lb (99.3 kg)  09/05/21 218 lb (98.9 kg)  03/05/21 213 lb (96.6 kg)   BMI Readings from Last 3 Encounters:  09/18/21 41.38 kg/m  09/05/21 41.19 kg/m  03/05/21 40.25 kg/m     Vaccines:   HPV: up to date Tdap: up to date Shingrix: N/A Pneumonia: N/A Flu: up to date COVID-19: up to date   Hep C Screening: 02/19/19 STD testing and prevention (HIV/chl/gon/syphilis): 01/26/15 Intimate partner violence: negative Sexual History :never  Menstrual History/LMP/Abnormal Bleeding: regular cycles, heavy day 2-3 only lasts 4 days, every 28 days  Discussed importance of follow up if any post-menopausal bleeding: not applicable Incontinence Symptoms: No.  Breast cancer:  - Last Mammogram: Mammogram  - BRCA gene screening: N/A   Osteoporosis Prevention : Discussed  high calcium and vitamin D supplementation, weight bearing exercises Bone density : two maternal aunts diagnosed with breast cancer one around 52 and one around age 29 - not sure about genetic testing.   Cervical cancer screening: today  Skin cancer: Discussed monitoring for atypical lesions  Colorectal cancer: N/A   Lung cancer:  Low Dose CT Chest recommended if Age 63-80 years, 20 pack-year currently smoking OR have quit w/in 15years. Patient does not qualify.   ECG: 02/19/19  Advanced Care Planning: A voluntary discussion about advance care planning including the explanation and discussion of advance directives.  Discussed health care proxy and Living will, and the patient was able to identify a health care proxy as parents .  Patient does not  have a living will or power or attorney of health care   Lipids: Lab Results  Component Value Date   CHOL 172 03/05/2021   CHOL 164 03/02/2020   CHOL 171 02/19/2019   Lab Results  Component Value Date   HDL 46 (L) 03/05/2021   HDL 39 (L) 03/02/2020   HDL 38 (L) 02/19/2019   Lab Results  Component Value Date   LDLCALC 104 (H) 03/05/2021   LDLCALC 105 (H) 03/02/2020   LDLCALC 109 (H) 02/19/2019   Lab Results  Component Value Date   TRIG 122 03/05/2021   TRIG 104 03/02/2020   TRIG 125 02/19/2019   Lab Results  Component Value Date   CHOLHDL 3.7 03/05/2021   CHOLHDL 4.2 03/02/2020   CHOLHDL 4.5 02/19/2019   No results found for: LDLDIRECT  Glucose: Glucose, Bld  Date Value Ref Range Status  03/05/2021 81 65 - 99 mg/dL Final    Comment:    .            Fasting reference interval .   03/02/2020 84 65 - 99 mg/dL Final    Comment:    .            Fasting reference interval .   02/19/2019 82 65 - 99 mg/dL Final    Comment:    .            Fasting reference interval .     Patient Active Problem List   Diagnosis Date Noted   Morbid obesity with body mass index (BMI) of 40.0 to 44.9 in adult [facility]) 09/05/2021    Acanthosis nigricans 12/31/2014   Essential (primary) hypertension 29/56/2130   Dysmetabolic syndrome 86/57/8469   Obesity 12/31/2014   Perennial allergic rhinitis 12/31/2014   Vitamin D deficiency 12/31/2014    Past Surgical History:  Procedure Laterality Date   MOUTH SURGERY      Family History  Problem Relation Age of Onset   Hypertension Mother    Arthritis Mother    Hypertension Father    Diabetes Paternal Grandmother    Breast cancer Maternal Aunt 68   Breast cancer Other 38       maunt    Social History   Socioeconomic History   Marital status: Single    Spouse name: Not on file   Number of children: 0   Years of education: 14   Highest education level: Associate degree: academic program  Occupational History   Occupation: Web designer   Tobacco Use   Smoking status: Never   Smokeless tobacco: Never  Vaping Use   Vaping Use: Never used  Substance and Sexual Activity   Alcohol use: Yes    Alcohol/week: 0.0 standard drinks    Comment: Socially   Drug use: No   Sexual activity: Not Currently    Birth control/protection: Abstinence  Other Topics Concern   Not on file  Social History Narrative   Lives with mother, father ( [place] ) and two sisters.   Social Determinants of Health   Financial Resource Strain: Low Risk    Difficulty of Paying Living Expenses: Not hard at all  Food Insecurity: No Food Insecurity   Worried About Charity fundraiser in the Last Year: Never true   [place] in the Last Year: Never true  Transportation Needs: No Transportation Needs   Lack of Transportation (Medical): No   Lack of Transportation (Non-Medical): No  Physical Activity: Insufficiently Active   Days of Exercise per Week: 4 days   Minutes of Exercise per Session: 30 min  Stress: No Stress Concern Present   Feeling of Stress : Not at all  Social Connections: Socially Isolated   Frequency of Communication with Friends and Family: Once a week    Frequency of Social Gatherings with Friends and Family: Once a week   Attends Religious Services: More than 4 times per year   Active Member of [employer] or Organizations: No   Attends Archivist Meetings: Never   Marital Status:  Never married  Human resources officer Violence: Not At Risk   Fear of Current or Ex-Partner: No   Emotionally Abused: No   Physically Abused: No   Sexually Abused: No     Current Outpatient Medications:    Cholecalciferol (VITAMIN D) 2000 UNITS tablet, Take 1 tablet by mouth daily., Disp: , Rfl:    fluticasone (FLONASE) 50 MCG/ACT nasal spray, SPRAY 2 SPRAYS INTO EACH NOSTRIL EVERY DAY, Disp: 48 mL, Rfl: 0   hydrochlorothiazide (HYDRODIURIL) 12.5 MG tablet, Take 1 tablet (12.5 mg total) by mouth daily., Disp: 90 tablet, Rfl: 1   loratadine (CLARITIN) 10 MG tablet, Take 1 tablet (10 mg total) by mouth daily., Disp: 100 tablet, Rfl: 2   montelukast (SINGULAIR) 10 MG tablet, TAKE 1 TABLET BY MOUTH EVERY DAY, Disp: 90 tablet, Rfl: 1   Multiple Vitamin tablet, Take 1 tablet by mouth daily., Disp: , Rfl:    Olopatadine HCl 0.2 % SOLN, Apply 1 drop to eye daily., Disp: 2.5 mL, Rfl: 5  No Known Allergies   ROS  Constitutional: Negative for fever or weight change.  Respiratory: Negative for cough and shortness of breath.   Cardiovascular: Negative for chest pain or palpitations.  Gastrointestinal: Negative for abdominal pain, no bowel changes.  Musculoskeletal: Negative for gait problem or joint swelling.  Skin: Negative for rash.  Neurological: Negative for dizziness or headache.  No other specific complaints in a complete review of systems (except as listed in HPI above).   Objective  Vitals:   09/18/21 0749  BP: 134/80  Pulse: 91  Resp: 16  SpO2: 98%  Weight: 219 lb (99.3 kg)  Height: _0  (1.549 m)    Body mass index is 41.38 kg/m.  Physical Exam  Constitutional: Patient appears well-developed and well-nourished. No distress.  HENT: Head:  Normocephalic and atraumatic. Ears: B TMs ok, no erythema or effusion; Nose: Not done Mouth/Throat: not done Eyes: Conjunctivae and EOM are normal. Pupils are equal, round, and reactive to light. No scleral icterus.  Neck: Normal range of motion. Neck supple. No JVD present. No thyromegaly present.  Cardiovascular: Normal rate, regular rhythm and normal heart sounds.  No murmur heard. No BLE edema. Pulmonary/Chest: Effort normal and breath sounds normal. No respiratory distress. Abdominal: Soft. Bowel sounds are normal, no distension. There is no tenderness. no masses Breast: no lumps or masses, no nipple discharge or rashes FEMALE GENITALIA:  External genitalia normal External urethra normal Vaginal vault normal without discharge or lesions Cervix normal without discharge or lesions Bimanual exam normal without masses RECTAL: not done  Musculoskeletal: Normal range of motion, no joint effusions. No gross deformities Neurological: he is alert and oriented to person, place, and time. No cranial nerve deficit. Coordination, balance, strength, speech and gait are normal.  Skin: Skin is warm and dry. No rash noted. No erythema.  Psychiatric: Patient has a normal mood and affect. behavior is normal. Judgment and thought content normal.   Fall Risk: Fall Risk  09/18/2021 09/05/2021 03/05/2021 09/04/2020 03/02/2020  Falls in the past year? 0 0 0 0 0  Number falls in past yr: 0 0 0 0 0  Injury with Fall? 0 0 0 0 0  Comment - - - - -  Risk for fall due to : No Fall Risks No Fall Risks - - -  Risk for fall due to: Comment - - - - -  Follow up Falls prevention discussed Falls prevention discussed Falls evaluation completed - -  Functional Status Survey: Is the patient deaf or have difficulty hearing?: No Does the patient have difficulty seeing, even when wearing glasses/contacts?: No Does the patient have difficulty concentrating, remembering, or making decisions?: No Does the patient have  difficulty walking or climbing stairs?: No Does the patient have difficulty dressing or bathing?: No Does the patient have difficulty doing errands alone such as visiting a doctor's office or shopping?: No   Assessment & Plan  1. Well adult exam   2. Cervical cancer screening  - Cytology - PAP   3. Breast cancer screening by mammogram   - MM 3D SCREEN BREAST BILATERAL; Future   -USPSTF grade A and B recommendations reviewed with patient; age-appropriate recommendations, preventive care, screening tests, etc discussed and encouraged; healthy living encouraged; see AVS for patient education given to patient -Discussed importance of 150 minutes of physical activity weekly, eat two servings of fish weekly, eat one serving of tree nuts ( cashews, pistachios, pecans, almonds.Marland Kitchen) every other day, eat 6 servings of fruit/vegetables daily and drink plenty of water and avoid sweet beverages.   -Reviewed Health Maintenance: Yes.

## 2021-09-18 ENCOUNTER — Other Ambulatory Visit: Payer: Self-pay

## 2021-09-18 ENCOUNTER — Other Ambulatory Visit (HOSPITAL_COMMUNITY)
Admission: RE | Admit: 2021-09-18 | Discharge: 2021-09-18 | Disposition: A | Discharge status: Home / Self Care | Payer: BC Managed Care – PPO | Source: Ambulatory Visit | Attending: Family Medicine | Admitting: Family Medicine

## 2021-09-18 ENCOUNTER — Encounter: Payer: Self-pay | Admitting: Family Medicine

## 2021-09-18 ENCOUNTER — Ambulatory Visit (INDEPENDENT_AMBULATORY_CARE_PROVIDER_SITE_OTHER): Payer: BC Managed Care – PPO | Admitting: Family Medicine

## 2021-09-18 VITALS — BP 134/80 | HR 91 | Resp 16 | Ht 61.0 in | Wt 219.0 lb

## 2021-09-18 DIAGNOSIS — Z124 Encounter for screening for malignant neoplasm of cervix: Secondary | ICD-10-CM

## 2021-09-18 DIAGNOSIS — Z1231 Encounter for screening mammogram for malignant neoplasm of breast: Secondary | ICD-10-CM

## 2021-09-18 DIAGNOSIS — Z0000 Encounter for general adult medical examination without abnormal findings: Secondary | ICD-10-CM

## 2021-09-19 LAB — CYTOLOGY - PAP
Comment: NEGATIVE
Diagnosis: NEGATIVE
High risk HPV: NEGATIVE

## 2022-02-07 ENCOUNTER — Telehealth: Payer: BC Managed Care – PPO | Admitting: Emergency Medicine

## 2022-02-07 DIAGNOSIS — R110 Nausea: Secondary | ICD-10-CM

## 2022-02-07 MED ORDER — ONDANSETRON 4 MG PO TBDP: 4.0000 mg | ORAL_TABLET | Freq: Three times a day (TID) | ORAL | 0 refills | Status: DC | PRN

## 2022-02-07 NOTE — Patient Instructions (Signed)
E-Visit for Vomiting  We are sorry that you are not feeling well. Here is how we plan to help!  Based on what you have shared with me it looks like you have a Virus that is irritating your GI tract.  Vomiting is the forceful emptying of a portion of the stomach's content through the mouth.  Although nausea and vomiting can make you feel miserable, it's important to remember that these are not diseases, but rather symptoms of an underlying illness.  When we treat short term symptoms, we always caution that any symptoms that persist should be fully evaluated in a medical office.  I have prescribed a medication that will help alleviate your symptoms and allow you to stay hydrated:  Zofran 4 mg 1 tablet every 8 hours as needed for nausea and vomiting  HOME CARE: Drink clear liquids.  This is very important! Dehydration (the lack of fluid) can lead to a serious complication.  Start off with 1 tablespoon every 5 minutes for 8 hours. You may begin eating bland foods after 8 hours without vomiting.  Start with saltine crackers, white bread, rice, mashed potatoes, applesauce. After 48 hours on a bland diet, you may resume a normal diet. Try to go to sleep.  Sleep often empties the stomach and relieves the need to vomit.  GET HELP RIGHT AWAY IF:  Your symptoms do not improve or worsen within 2 days after treatment. You have a fever for over 3 days. You cannot keep down fluids after trying the medication.  MAKE SURE YOU:  Understand these instructions. Will watch your condition. Will get help right away if you are not doing well or get worse.

## 2022-02-07 NOTE — Progress Notes (Signed)
Virtual Visit Consent   Kaitlin Schroeder, you are scheduled for a virtual visit with a [facility] provider today. Just as with appointments in the office, your consent must be obtained to participate. Your consent will be active for this visit and any virtual visit you may have with one of our providers in the next 365 days. If you have a MyChart account, a copy of this consent can be sent to you electronically.  As this is a virtual visit, video technology does not allow for your provider to perform a traditional examination. This may limit your provider's ability to fully assess your condition. If your provider identifies any concerns that need to be evaluated in person or the need to arrange testing (such as labs, EKG, etc.), we will make arrangements to do so. Although advances in technology are sophisticated, we cannot ensure that it will always work on either your end or our end. If the connection with a video visit is poor, the visit may have to be switched to a telephone visit. With either a video or telephone visit, we are not always able to ensure that we have a secure connection.  By engaging in this virtual visit, you consent to the provision of healthcare and authorize for your insurance to be billed (if applicable) for the services provided during this visit. Depending on your insurance coverage, you may receive a charge related to this service.  I need to obtain your verbal consent now. Are you willing to proceed with your visit today? Kaitlin Schroeder has provided verbal consent on 02/07/2022 for a virtual visit (video or telephone). Kaitlin Horseman, PA-C  Date: 02/07/2022 10:58 AM  Virtual Visit via Video Note   I, Kaitlin Schroeder, connected with  Kaitlin Schroeder  ([id number], [birth year]) on 02/07/22 at 11:00 AM EDT by a video-enabled telemedicine application and verified that I am speaking with the correct person using two identifiers.  Location: Patient: Virtual Visit Location Patient:  Home Provider: Virtual Visit Location Provider: Home Office   I discussed the limitations of evaluation and management by telemedicine and the availability of in person appointments. The patient expressed understanding and agreed to proceed.    History of Present Illness: Kaitlin Schroeder is a 41 y.o. who identifies as a female who was assigned female at birth, and is being seen today for nausea for the past several days.  Denies vomiting.  States that she had some diarrhea after eating [employer].  Denies severe abdominal pain.  Denies any concerns for pregnancy.  Denies fever.  HPI: HPI  Problems:  Patient Active Problem List   Diagnosis Date Noted   Morbid obesity with body mass index (BMI) of 40.0 to 44.9 in adult [facility]) 09/05/2021   Acanthosis nigricans 12/31/2014   Essential (primary) hypertension 12/31/2014   Dysmetabolic syndrome 12/31/2014   Obesity 12/31/2014   Perennial allergic rhinitis 12/31/2014   Vitamin D deficiency 12/31/2014    Allergies: No Known Allergies Medications:  Current Outpatient Medications:    Cholecalciferol (VITAMIN D) 2000 UNITS tablet, Take 1 tablet by mouth daily., Disp: , Rfl:    fluticasone (FLONASE) 50 MCG/ACT nasal spray, SPRAY 2 SPRAYS INTO EACH NOSTRIL EVERY DAY, Disp: 48 mL, Rfl: 0   hydrochlorothiazide (HYDRODIURIL) 12.5 MG tablet, Take 1 tablet (12.5 mg total) by mouth daily., Disp: 90 tablet, Rfl: 1   loratadine (CLARITIN) 10 MG tablet, Take 1 tablet (10 mg total) by mouth daily., Disp: 100 tablet, Rfl: 2   montelukast (SINGULAIR) 10 MG tablet,  TAKE 1 TABLET BY MOUTH EVERY DAY, Disp: 90 tablet, Rfl: 1   Multiple Vitamin tablet, Take 1 tablet by mouth daily., Disp: , Rfl:    Olopatadine HCl 0.2 % SOLN, Apply 1 drop to eye daily., Disp: 2.5 mL, Rfl: 5  Observations/Objective: Patient is well-developed, well-nourished in no acute distress.  Resting comfortably at home.  Head is normocephalic, atraumatic.  No labored breathing.  Speech is clear  and coherent with logical content.  Patient is alert and oriented at baseline.    Assessment and Plan: 1. Nausea  - Restrict diet to bland BRAT.   -Zofran for nausea -In-person follow-up if not improving in the next couple days.  Follow Up Instructions: I discussed the assessment and treatment plan with the patient. The patient was provided an opportunity to ask questions and all were answered. The patient agreed with the plan and demonstrated an understanding of the instructions.  A copy of instructions were sent to the patient via MyChart unless otherwise noted below.     The patient was advised to call back or seek an in-person evaluation if the symptoms worsen or if the condition fails to improve as anticipated.  Time:  I spent 10 minutes with the patient via telehealth technology discussing the above problems/concerns.    Kaitlin Horseman, PA-C

## 2022-02-19 NOTE — Progress Notes (Unsigned)
Name: Kaitlin Schroeder   MRN: [id number]    DOB: Nov 13, 1980   Date:02/20/2022       Progress Note  Subjective  Chief Complaint  Follow Up  HPI  HTN: patient is taking HCTZ, and denies side effects. No complaints of chest pain, edema, palpitation or SOB. She has been checking her blood pressure at home, 130's/80's. Today is 132/86. Discussed adjusting medication but she prefers holding off for now    Perennial Allergic Rhinitis: taking Loratadine  and singulair and nasal spray but not daily. Uses Olopatadine occasionally for eye symptoms, usually worse in the mornings - watery, she also has more congesting in the mornings that improves when anti-histamine kicks in .    Adult Obesity: insurance stopped paying for Qsymia, her original weight was 218 lbs on 04/2013.  She was down to 199 lbs in December 2016 but medication had to be switched to [facility] around Feb 2017 after a gap on medication and she is up to [employer] stopped paying for Contrave because she was not losing weight. She was given Victoza Dec 2017 but she did not lose much weight, started on Ozempic 09/2016 at 213 lbs. Weight back in 02/2020 ws 210 lbs, she decided to stop Ozempic mid Nov 22 , she gained some weight , she was 219 lbs beginning of Feb 22 when she joined [employer].  Weight today is 216 lbs , she is happy with Weight [employer]. Exercising 3-4 days a week, goal is 5 days a week.   Metabolic Syndrome: hgbA1C was 6.2% in  02/2021.  She denies polyphagia, polydipsia or polyuria. She is still doing Weight Watchers and she has been physically activity.   Recurrent Nausea: it started three weeks ago it is intermittent, lasts hours to days, does not seem to be triggered by meals, not associated with fever or chills, no abdominal pain but usually not associated with bowel changes, however had another episode yesterday and it was associated with increase in bowel frequency - 3 times yesterday . Denies change in diet, no recent  travels. Not pregnant  Patient Active Problem List   Diagnosis Date Noted   Morbid obesity with body mass index (BMI) of 40.0 to 44.9 in adult [facility]) 09/05/2021   Acanthosis nigricans 12/31/2014   Essential (primary) hypertension 12/31/2014   Dysmetabolic syndrome 12/31/2014   Obesity 12/31/2014   Perennial allergic rhinitis 12/31/2014   Vitamin D deficiency 12/31/2014    Past Surgical History:  Procedure Laterality Date   MOUTH SURGERY      Family History  Problem Relation Age of Onset   Hypertension Mother    Arthritis Mother    Hypertension Father    Diabetes Paternal Grandmother    Breast cancer Maternal Aunt 74   Breast cancer Other 49       maunt    Social History   Tobacco Use   Smoking status: Never   Smokeless tobacco: Never  Substance Use Topics   Alcohol use: Yes    Alcohol/week: 0.0 standard drinks of alcohol    Comment: Socially     Current Outpatient Medications:    Cholecalciferol (VITAMIN D) 2000 UNITS tablet, Take 1 tablet by mouth daily., Disp: , Rfl:    fluticasone (FLONASE) 50 MCG/ACT nasal spray, SPRAY 2 SPRAYS INTO EACH NOSTRIL EVERY DAY, Disp: 48 mL, Rfl: 0   hydrochlorothiazide (HYDRODIURIL) 12.5 MG tablet, Take 1 tablet (12.5 mg total) by mouth daily., Disp: 90 tablet, Rfl: 1   loratadine (CLARITIN) 10 MG  tablet, Take 1 tablet (10 mg total) by mouth daily., Disp: 100 tablet, Rfl: 2   montelukast (SINGULAIR) 10 MG tablet, TAKE 1 TABLET BY MOUTH EVERY DAY, Disp: 90 tablet, Rfl: 1   Multiple Vitamin tablet, Take 1 tablet by mouth daily., Disp: , Rfl:    Olopatadine HCl 0.2 % SOLN, Apply 1 drop to eye daily., Disp: 2.5 mL, Rfl: 5   ondansetron (ZOFRAN-ODT) 4 MG disintegrating tablet, Take 1 tablet (4 mg total) by mouth every 8 (eight) hours as needed for nausea or vomiting., Disp: 10 tablet, Rfl: 0  No Known Allergies  I personally reviewed active problem list, medication list, allergies, family history, social history, health maintenance with  the patient/caregiver today.   ROS  Constitutional: Negative for fever or weight change.  Respiratory: Negative for cough and shortness of breath.   Cardiovascular: Negative for chest pain or palpitations.  Gastrointestinal: Negative for abdominal pain, positive for  bowel changes - yesterday only Recurrent nausea   Musculoskeletal: Negative for gait problem or joint swelling.  Skin: Negative for rash.  Neurological: Negative for dizziness or headache.  No other specific complaints in a complete review of systems (except as listed in HPI above).   Objective  Vitals:   02/20/22 0752  BP: 132/86  Pulse: 98  Resp: 16  SpO2: 99%  Weight: 216 lb (98 kg)  Height: 5\' 1"  (1.549 m)    Body mass index is 40.81 kg/m.  Physical Exam  Constitutional: Patient appears well-developed and well-nourished. Obese  No distress.  HEENT: head atraumatic, normocephalic, pupils equal and reactive to light,, neck supple, Cardiovascular: Normal rate, regular rhythm and normal heart sounds.  No murmur heard. No BLE edema. Pulmonary/Chest: Effort normal and breath sounds normal. No respiratory distress. Abdominal: Soft.  There is no tenderness.normal bowel sounds  Psychiatric: Patient has a normal mood and affect. behavior is normal. Judgment and thought content normal.    PHQ2/9:    02/20/2022    7:53 AM 09/18/2021    7:49 AM 09/05/2021    7:37 AM 03/05/2021    7:57 AM 09/04/2020    7:31 AM  Depression screen PHQ 2/9  Decreased Interest 0 0 0 0 0  Down, Depressed, Hopeless 0 0 0 0 0  PHQ - 2 Score 0 0 0 0 0  Altered sleeping 0 0 0    Tired, decreased energy 0 0 0    Change in appetite 0 0 0    Feeling bad or failure about yourself  0 0 0    Trouble concentrating 0 0 0    Moving slowly or fidgety/restless 0 0 0    Suicidal thoughts 0 0 0    PHQ-9 Score 0 0 0      phq 9 is negative   Fall Risk:    02/20/2022    7:53 AM 09/18/2021    7:49 AM 09/05/2021    7:37 AM 03/05/2021    7:57 AM  09/04/2020    7:31 AM  Fall Risk   Falls in the past year? 0 0 0 0 0  Number falls in past yr: 0 0 0 0 0  Injury with Fall? 0 0 0 0 0  Risk for fall due to : No Fall Risks No Fall Risks No Fall Risks    Follow up Falls prevention discussed Falls prevention discussed Falls prevention discussed Falls evaluation completed       Functional Status Survey: Is the patient deaf or have difficulty hearing?:  No Does the patient have difficulty seeing, even when wearing glasses/contacts?: No Does the patient have difficulty concentrating, remembering, or making decisions?: No Does the patient have difficulty walking or climbing stairs?: No Does the patient have difficulty dressing or bathing?: No Does the patient have difficulty doing errands alone such as visiting a doctor's office or shopping?: No    Assessment & Plan  1. Essential (primary) hypertension  - hydrochlorothiazide (HYDRODIURIL) 12.5 MG tablet; Take 1 tablet (12.5 mg total) by mouth daily.  Dispense: 90 tablet; Refill: 1  2. Morbid obesity with body mass index (BMI) of 40.0 to 44.9 in adult [facility])  Discussed with the patient the risk posed by an increased BMI. Discussed importance of portion control, calorie counting and at least 150 minutes of physical activity weekly. Avoid sweet beverages and drink more water. Eat at least 6 servings of fruit and vegetables daily    3. Metabolic syndrome  - Hemoglobin A1c  4. Lipid screening  - Lipid panel  5. Vitamin D deficiency  - VITAMIN D 25 Hydroxy (Vit-D Deficiency, Fractures)  6. Dyspepsia  - H. pylori breath test  7. Nausea in adult  - ondansetron (ZOFRAN-ODT) 4 MG disintegrating tablet; Take 1 tablet (4 mg total) by mouth every 8 (eight) hours as needed for nausea or vomiting.  Dispense: 20 tablet; Refill: 0 - CBC with Differential/Platelet - COMPLETE METABOLIC PANEL WITH GFR - H. pylori breath test - Lipase  8. Perennial allergic rhinitis  - montelukast  (SINGULAIR) 10 MG tablet; Take 1 tablet (10 mg total) by mouth daily.  Dispense: 90 tablet; Refill: 1

## 2022-02-20 ENCOUNTER — Ambulatory Visit: Payer: BC Managed Care – PPO | Admitting: Family Medicine

## 2022-02-20 ENCOUNTER — Encounter: Payer: Self-pay | Admitting: Family Medicine

## 2022-02-20 VITALS — BP 132/86 | HR 98 | Resp 16 | Ht 61.0 in | Wt 216.0 lb

## 2022-02-20 DIAGNOSIS — I10 Essential (primary) hypertension: Secondary | ICD-10-CM

## 2022-02-20 DIAGNOSIS — Z13220 Encounter for screening for lipoid disorders: Secondary | ICD-10-CM | POA: Diagnosis not present

## 2022-02-20 DIAGNOSIS — E8881 Metabolic syndrome: Secondary | ICD-10-CM

## 2022-02-20 DIAGNOSIS — R1013 Epigastric pain: Secondary | ICD-10-CM

## 2022-02-20 DIAGNOSIS — R110 Nausea: Secondary | ICD-10-CM

## 2022-02-20 DIAGNOSIS — J3089 Other allergic rhinitis: Secondary | ICD-10-CM

## 2022-02-20 DIAGNOSIS — H1013 Acute atopic conjunctivitis, bilateral: Secondary | ICD-10-CM

## 2022-02-20 DIAGNOSIS — Z6841 Body Mass Index (BMI) 40.0 and over, adult: Secondary | ICD-10-CM

## 2022-02-20 DIAGNOSIS — E559 Vitamin D deficiency, unspecified: Secondary | ICD-10-CM

## 2022-02-20 MED ORDER — ONDANSETRON 4 MG PO TBDP: 4.0000 mg | ORAL_TABLET | Freq: Three times a day (TID) | ORAL | 0 refills | Status: DC | PRN

## 2022-02-20 MED ORDER — HYDROCHLOROTHIAZIDE 12.5 MG PO TABS: 12.5000 mg | ORAL_TABLET | Freq: Every day | ORAL | 1 refills | Status: DC

## 2022-02-20 MED ORDER — MONTELUKAST SODIUM 10 MG PO TABS: 10.0000 mg | ORAL_TABLET | Freq: Every day | ORAL | 1 refills | Status: DC

## 2022-02-20 MED ORDER — OLOPATADINE HCL 0.2 % OP SOLN: 1.0000 [drp] | Freq: Every day | OPHTHALMIC | 5 refills | Status: DC

## 2022-02-25 ENCOUNTER — Encounter: Payer: Self-pay | Admitting: Family Medicine

## 2022-02-25 LAB — CBC WITH DIFFERENTIAL/PLATELET
Absolute Monocytes: 555 cells/uL (ref 200–950)
Basophils Absolute: 37 cells/uL (ref 0–200)
Basophils Relative: 0.5 %
Eosinophils Absolute: 96 cells/uL (ref 15–500)
Eosinophils Relative: 1.3 %
HCT: 39.7 % (ref 35.0–45.0)
Hemoglobin: 13.9 g/dL (ref 11.7–15.5)
Lymphs Abs: 2028 cells/uL (ref 850–3900)
MCH: 31.7 pg (ref 27.0–33.0)
MCHC: 35 g/dL (ref 32.0–36.0)
MCV: 90.6 fL (ref 80.0–100.0)
MPV: 10.4 fL (ref 7.5–12.5)
Monocytes Relative: 7.5 %
Neutro Abs: 4684 cells/uL (ref 1500–7800)
Neutrophils Relative %: 63.3 %
Platelets: 372 10*3/uL (ref 140–400)
RBC: 4.38 10*6/uL (ref 3.80–5.10)
RDW: 12.7 % (ref 11.0–15.0)
Total Lymphocyte: 27.4 %
WBC: 7.4 10*3/uL (ref 3.8–10.8)

## 2022-02-25 LAB — LIPID PANEL
Cholesterol: 172 mg/dL (ref <200)
HDL: 42 mg/dL — ABNORMAL LOW (ref >=50)
LDL Cholesterol (Calc): 101 mg/dL (calc) — ABNORMAL HIGH
Non-HDL Cholesterol (Calc): 130 mg/dL (calc) — ABNORMAL HIGH (ref <130)
Total CHOL/HDL Ratio: 4.1 (calc) (ref <5.0)
Triglycerides: 176 mg/dL — ABNORMAL HIGH (ref <150)

## 2022-02-25 LAB — COMPLETE METABOLIC PANEL WITH GFR
AG Ratio: 1.4 (calc) (ref 1.0–2.5)
ALT: 19 U/L (ref 6–29)
AST: 15 U/L (ref 10–30)
Albumin: 4.3 g/dL (ref 3.6–5.1)
Alkaline phosphatase (APISO): 51 U/L (ref 31–125)
BUN/Creatinine Ratio: 10 (calc) (ref 6–22)
BUN: 11 mg/dL (ref 7–25)
CO2: 27 mmol/L (ref 20–32)
Calcium: 10.2 mg/dL (ref 8.6–10.2)
Chloride: 104 mmol/L (ref 98–110)
Creat: 1.07 mg/dL — ABNORMAL HIGH (ref 0.50–0.99)
Globulin: 3 g/dL (calc) (ref 1.9–3.7)
Glucose, Bld: 97 mg/dL (ref 65–99)
Potassium: 4.8 mmol/L (ref 3.5–5.3)
Sodium: 140 mmol/L (ref 135–146)
Total Bilirubin: 0.3 mg/dL (ref 0.2–1.2)
Total Protein: 7.3 g/dL (ref 6.1–8.1)
eGFR: 67 mL/min/{1.73_m2} (ref >=60)

## 2022-02-25 LAB — HEMOGLOBIN A1C
Hgb A1c MFr Bld: 5.4 % of total Hgb (ref <5.7)
Mean Plasma Glucose: 108 mg/dL
eAG (mmol/L): 6 mmol/L

## 2022-02-25 LAB — H. PYLORI BREATH TEST: H. pylori Breath Test: NOT DETECTED

## 2022-02-25 LAB — LIPASE: Lipase: 39 U/L (ref 7–60)

## 2022-02-25 LAB — VITAMIN D 25 HYDROXY (VIT D DEFICIENCY, FRACTURES): Vit D, 25-Hydroxy: 45 ng/mL (ref 30–100)

## 2022-03-06 ENCOUNTER — Ambulatory Visit: Payer: BC Managed Care – PPO | Admitting: Family Medicine

## 2022-04-03 ENCOUNTER — Ambulatory Visit
Admission: RE | Admit: 2022-04-03 | Discharge: 2022-04-03 | Disposition: A | Discharge status: Home / Self Care | Payer: BC Managed Care – PPO | Source: Ambulatory Visit | Attending: Family Medicine | Admitting: Family Medicine

## 2022-04-03 DIAGNOSIS — Z1231 Encounter for screening mammogram for malignant neoplasm of breast: Secondary | ICD-10-CM | POA: Diagnosis not present

## 2022-04-03 DIAGNOSIS — Z124 Encounter for screening for malignant neoplasm of cervix: Secondary | ICD-10-CM | POA: Diagnosis present

## 2022-04-04 ENCOUNTER — Other Ambulatory Visit: Payer: Self-pay | Admitting: Family Medicine

## 2022-04-04 DIAGNOSIS — R928 Other abnormal and inconclusive findings on diagnostic imaging of breast: Secondary | ICD-10-CM

## 2022-04-04 DIAGNOSIS — N63 Unspecified lump in unspecified breast: Secondary | ICD-10-CM

## 2022-04-05 ENCOUNTER — Other Ambulatory Visit: Payer: Self-pay

## 2022-04-05 DIAGNOSIS — R928 Other abnormal and inconclusive findings on diagnostic imaging of breast: Secondary | ICD-10-CM

## 2022-04-05 NOTE — Progress Notes (Signed)
Added

## 2022-04-25 ENCOUNTER — Ambulatory Visit
Admission: RE | Admit: 2022-04-25 | Discharge: 2022-04-25 | Disposition: A | Discharge status: Home / Self Care | Payer: BC Managed Care – PPO | Source: Ambulatory Visit | Attending: Family Medicine | Admitting: Family Medicine

## 2022-04-25 ENCOUNTER — Ambulatory Visit: Payer: BC Managed Care – PPO

## 2022-04-25 DIAGNOSIS — R928 Other abnormal and inconclusive findings on diagnostic imaging of breast: Secondary | ICD-10-CM | POA: Diagnosis present

## 2022-04-25 DIAGNOSIS — N63 Unspecified lump in unspecified breast: Secondary | ICD-10-CM | POA: Diagnosis present

## 2022-04-26 ENCOUNTER — Ambulatory Visit (INDEPENDENT_AMBULATORY_CARE_PROVIDER_SITE_OTHER): Payer: BC Managed Care – PPO

## 2022-04-26 DIAGNOSIS — Z23 Encounter for immunization: Secondary | ICD-10-CM

## 2022-07-10 ENCOUNTER — Encounter: Payer: Self-pay | Admitting: Family Medicine

## 2022-08-03 ENCOUNTER — Other Ambulatory Visit: Payer: Self-pay | Admitting: Family Medicine

## 2022-08-05 MED ORDER — FLUTICASONE PROPIONATE 50 MCG/ACT NA SUSP: 2.0000 | Freq: Every day | NASAL | 0 refills | Status: DC

## 2022-08-22 NOTE — Progress Notes (Signed)
Name: Kaitlin Schroeder   MRN: [id number]    DOB: September 26, 1980   Date:08/23/2022       Progress Note  Subjective  Chief Complaint  Follow Up  HPI  HTN: patient is taking HCTZ, and denies side effects. No complaints of chest pain, edema, palpitation or SOB. She has been checking her blood pressure at home, 130's/80's at home, it was higher today when she first arrived at 148/88 down to 138/86, we will add ARB to her current regiment . Discussed possible side effects    Perennial Allergic Rhinitis: taking Loratadine  and singulair and nasal spray but not daily. Uses Olopatadine occasionally for eye symptoms, usually worse in the mornings - watery. She had to resume nasal spray due to facial pressure    Adult Obesity: insurance stopped paying for Qsymia, her original weight was 218 lbs on 04/2013.  She was down to 199 lbs in December 2016 but medication had to be switched to [facility] around Feb 2017 after a gap on medication and she is up to [employer] stopped paying for Contrave because she was not losing weight. She was given Victoza Dec 2017 but she did not lose much weight, started on Ozempic 09/2016 at 213 lbs. Weight back in 02/2020 ws 210 lbs, she decided to stop Ozempic mid Nov 22 , she gained some weight , she was 219 lbs beginning of Feb 22 when she joined [employer].  Currently resumed physical activity January 2024 she is has been walking 30 minutes or doing Zumba class for about 30 minutes three days a week. She stopped Weight Watchers because it was not working   Metabolic Syndrome: [id number] was 5.4% in  02/2022.  She denies polyphagia, polydipsia or polyuria. She resumed regular physical activity January 2024  but no longer doing Weight Watchers   Nausea: happened last summer when she was worried about abnormal mammogram, no problems since. Discussed gallbladder disease.   Patient Active Problem List   Diagnosis Date Noted   Morbid obesity with body mass index (BMI) of 40.0 to 44.9  in adult [facility]) 09/05/2021   Acanthosis nigricans 12/31/2014   Essential (primary) hypertension [id number]   Dysmetabolic syndrome [id number]   Obesity 12/31/2014   Perennial allergic rhinitis 12/31/2014   Vitamin D deficiency 12/31/2014    Past Surgical History:  Procedure Laterality Date   MOUTH SURGERY      Family History  Problem Relation Age of Onset   Hypertension Mother    Arthritis Mother    Hypertension Father    Diabetes Paternal Grandmother    Breast cancer Maternal Aunt 68   Breast cancer Other 1       maunt    Social History   Tobacco Use   Smoking status: Never   Smokeless tobacco: Never  Substance Use Topics   Alcohol use: Yes    Alcohol/week: 0.0 standard drinks of alcohol    Comment: Socially     Current Outpatient Medications:    Cholecalciferol (VITAMIN D) 2000 UNITS tablet, Take 1 tablet by mouth daily., Disp: , Rfl:    fluticasone (FLONASE) 50 MCG/ACT nasal spray, Place 2 sprays into both nostrils daily., Disp: 48 mL, Rfl: 0   hydrochlorothiazide (HYDRODIURIL) 12.5 MG tablet, Take 1 tablet (12.5 mg total) by mouth daily., Disp: 90 tablet, Rfl: 1   loratadine (CLARITIN) 10 MG tablet, Take 1 tablet (10 mg total) by mouth daily., Disp: 100 tablet, Rfl: 2   montelukast (SINGULAIR) 10 MG tablet, Take 1 tablet (10  mg total) by mouth daily., Disp: 90 tablet, Rfl: 1   Multiple Vitamin tablet, Take 1 tablet by mouth daily., Disp: , Rfl:    Olopatadine HCl 0.2 % SOLN, Apply 1 drop to eye daily., Disp: 2.5 mL, Rfl: 5   ondansetron (ZOFRAN-ODT) 4 MG disintegrating tablet, Take 1 tablet (4 mg total) by mouth every 8 (eight) hours as needed for nausea or vomiting., Disp: 20 tablet, Rfl: 0  No Known Allergies  I personally reviewed active problem list, medication list, allergies, family history, social history, health maintenance with the patient/caregiver today.   ROS  Constitutional: Negative for fever, positive for  weight change.  Respiratory: Negative  for cough and shortness of breath.   Cardiovascular: Negative for chest pain or palpitations.  Gastrointestinal: Negative for abdominal pain, no bowel changes.  Musculoskeletal: Negative for gait problem or joint swelling.  Skin: Negative for rash.  Neurological: Negative for dizziness or headache.  No other specific complaints in a complete review of systems (except as listed in HPI above).   Objective  Vitals:   08/23/22 0834 08/23/22 0848  BP: (!) 148/88 138/86  Pulse: 98   Resp: 16   Temp: 98.3 F (36.8 C)   TempSrc: Oral   SpO2: 97%   Weight: 225 lb 11.2 oz (102.4 kg)   Height: 5' 1"$  (1.549 m)     Body mass index is 42.65 kg/m.  Physical Exam  Constitutional: Patient appears well-developed and well-nourished. Obese  No distress.  HEENT: head atraumatic, normocephalic, pupils equal and reactive to light, neck supple Cardiovascular: Normal rate, regular rhythm and normal heart sounds.  No murmur heard. No BLE edema. Pulmonary/Chest: Effort normal and breath sounds normal. No respiratory distress. Abdominal: Soft.  There is no tenderness. Psychiatric: Patient has a normal mood and affect. behavior is normal. Judgment and thought content normal.   PHQ2/9:    08/23/2022    8:34 AM 02/20/2022    7:53 AM 09/18/2021    7:49 AM 09/05/2021    7:37 AM 03/05/2021    7:57 AM  Depression screen PHQ 2/9  Decreased Interest 0 0 0 0 0  Down, Depressed, Hopeless 0 0 0 0 0  PHQ - 2 Score 0 0 0 0 0  Altered sleeping 0 0 0 0   Tired, decreased energy 0 0 0 0   Change in appetite 0 0 0 0   Feeling bad or failure about yourself  0 0 0 0   Trouble concentrating 0 0 0 0   Moving slowly or fidgety/restless 0 0 0 0   Suicidal thoughts 0 0 0 0   PHQ-9 Score 0 0 0 0   Difficult doing work/chores Not difficult at all        phq 9 is negative   Fall Risk:    08/23/2022    8:34 AM 02/20/2022    7:53 AM 09/18/2021    7:49 AM 09/05/2021    7:37 AM 03/05/2021    7:57 AM  Fall Risk   Falls in  the past year? 0 0 0 0 0  Number falls in past yr: 0 0 0 0 0  Injury with Fall? 0 0 0 0 0  Risk for fall due to : No Fall Risks No Fall Risks No Fall Risks No Fall Risks   Follow up Falls prevention discussed;Education provided;Falls evaluation completed Falls prevention discussed Falls prevention discussed Falls prevention discussed Falls evaluation completed      Functional Status Survey: Is  the patient deaf or have difficulty hearing?: No Does the patient have difficulty seeing, even when wearing glasses/contacts?: No Does the patient have difficulty concentrating, remembering, or making decisions?: No Does the patient have difficulty walking or climbing stairs?: No Does the patient have difficulty dressing or bathing?: No Does the patient have difficulty doing errands alone such as visiting a doctor's office or shopping?: No    Assessment & Plan  1. Morbid obesity with body mass index (BMI) of 40.0 to 44.9 in adult [facility])  Discussed with the patient the risk posed by an increased BMI. Discussed importance of portion control, calorie counting and at least 150 minutes of physical activity weekly. Avoid sweet beverages and drink more water. Eat at least 6 servings of fruit and vegetables daily    2. Perennial allergic rhinitis  - montelukast (SINGULAIR) 10 MG tablet; Take 1 tablet (10 mg total) by mouth daily.  Dispense: 90 tablet; Refill: 1  3. Vitamin D deficiency   4. Essential (primary) hypertension  - losartan-hydrochlorothiazide (HYZAAR) 50-12.5 MG tablet; Take 1 tablet by mouth daily.  Dispense: 90 tablet; Refill: 1  5. Metabolic syndrome

## 2022-08-23 ENCOUNTER — Encounter: Payer: Self-pay | Admitting: Family Medicine

## 2022-08-23 ENCOUNTER — Ambulatory Visit (INDEPENDENT_AMBULATORY_CARE_PROVIDER_SITE_OTHER): Payer: BC Managed Care – PPO | Admitting: Family Medicine

## 2022-08-23 VITALS — BP 138/86 | HR 98 | Temp 98.3°F | Resp 16 | Ht 61.0 in | Wt 225.7 lb

## 2022-08-23 DIAGNOSIS — E559 Vitamin D deficiency, unspecified: Secondary | ICD-10-CM

## 2022-08-23 DIAGNOSIS — Z6841 Body Mass Index (BMI) 40.0 and over, adult: Secondary | ICD-10-CM

## 2022-08-23 DIAGNOSIS — E8881 Metabolic syndrome: Secondary | ICD-10-CM

## 2022-08-23 DIAGNOSIS — J3089 Other allergic rhinitis: Secondary | ICD-10-CM | POA: Diagnosis not present

## 2022-08-23 DIAGNOSIS — I10 Essential (primary) hypertension: Secondary | ICD-10-CM

## 2022-08-23 MED ORDER — MONTELUKAST SODIUM 10 MG PO TABS: 10.0000 mg | ORAL_TABLET | Freq: Every day | ORAL | 1 refills | Status: DC

## 2022-08-23 MED ORDER — LOSARTAN POTASSIUM-HCTZ 50-12.5 MG PO TABS: 1.0000 | ORAL_TABLET | Freq: Every day | ORAL | 1 refills | Status: DC

## 2022-09-19 NOTE — Patient Instructions (Signed)
Preventive Care 40-43 Years Old, Female Preventive care refers to lifestyle choices and visits with your health care provider that can promote health and wellness. Preventive care visits are also called wellness exams. What can I expect for my preventive care visit? Counseling Your health care provider may ask you questions about your: Medical history, including: Past medical problems. Family medical history. Pregnancy history. Current health, including: Menstrual cycle. Method of birth control. Emotional well-being. Home life and relationship well-being. Sexual activity and sexual health. Lifestyle, including: Alcohol, nicotine or tobacco, and drug use. Access to firearms. Diet, exercise, and sleep habits. Work and work environment. Sunscreen use. Safety issues such as seatbelt and bike helmet use. Physical exam Your health care provider will check your: Height and weight. These may be used to calculate your BMI (body mass index). BMI is a measurement that tells if you are at a healthy weight. Waist circumference. This measures the distance around your waistline. This measurement also tells if you are at a healthy weight and may help predict your risk of certain diseases, such as type 2 diabetes and high blood pressure. Heart rate and blood pressure. Body temperature. Skin for abnormal spots. What immunizations do I need?  Vaccines are usually given at various ages, according to a schedule. Your health care provider will recommend vaccines for you based on your age, medical history, and lifestyle or other factors, such as travel or where you work. What tests do I need? Screening Your health care provider may recommend screening tests for certain conditions. This may include: Lipid and cholesterol levels. Diabetes screening. This is done by checking your blood sugar (glucose) after you have not eaten for a while (fasting). Pelvic exam and Pap test. Hepatitis B test. Hepatitis C  test. HIV (human immunodeficiency virus) test. STI (sexually transmitted infection) testing, if you are at risk. Lung cancer screening. Colorectal cancer screening. Mammogram. Talk with your health care provider about when you should start having regular mammograms. This may depend on whether you have a family history of breast cancer. BRCA-related cancer screening. This may be done if you have a family history of breast, ovarian, tubal, or peritoneal cancers. Bone density scan. This is done to screen for osteoporosis. Talk with your health care provider about your test results, treatment options, and if necessary, the need for more tests. Follow these instructions at home: Eating and drinking  Eat a diet that includes fresh fruits and vegetables, whole grains, lean protein, and low-fat dairy products. Take vitamin and mineral supplements as recommended by your health care provider. Do not drink alcohol if: Your health care provider tells you not to drink. You are pregnant, may be pregnant, or are planning to become pregnant. If you drink alcohol: Limit how much you have to 0-1 drink a day. Know how much alcohol is in your drink. In the [place] one drink equals one 12 oz bottle of beer (355 mL), one 5 oz glass of wine (148 mL), or one 1 oz glass of hard liquor (44 mL). Lifestyle Brush your teeth every morning and night with fluoride toothpaste. Floss one time each day. Exercise for at least 30 minutes 5 or more days each week. Do not use any products that contain nicotine or tobacco. These products include cigarettes, chewing tobacco, and vaping devices, such as e-cigarettes. If you need help quitting, ask your health care provider. Do not use drugs. If you are sexually active, practice safe sex. Use a condom or other form of protection to   prevent STIs. If you do not wish to become pregnant, use a form of birth control. If you plan to become pregnant, see your health care provider for a  prepregnancy visit. Take aspirin only as told by your health care provider. Make sure that you understand how much to take and what form to take. Work with your health care provider to find out whether it is safe and beneficial for you to take aspirin daily. Find healthy ways to manage stress, such as: Meditation, yoga, or listening to music. Journaling. Talking to a trusted person. Spending time with friends and family. Minimize exposure to UV radiation to reduce your risk of skin cancer. Safety Always wear your seat belt while driving or riding in a vehicle. Do not drive: If you have been drinking alcohol. Do not ride with someone who has been drinking. When you are tired or distracted. While texting. If you have been using any mind-altering substances or drugs. Wear a helmet and other protective equipment during sports activities. If you have firearms in your house, make sure you follow all gun safety procedures. Seek help if you have been physically or sexually abused. What's next? Visit your health care provider once a year for an annual wellness visit. Ask your health care provider how often you should have your eyes and teeth checked. Stay up to date on all vaccines. This information is not intended to replace advice given to you by your health care provider. Make sure you discuss any questions you have with your health care provider. Document Revised: 12/27/2020 Document Reviewed: 12/27/2020 Elsevier Patient Education  2023 [employer]  

## 2022-09-19 NOTE — Progress Notes (Signed)
Name: Cathalina Barcia   MRN: [id number]    DOB: 1980/08/31   Date:09/20/2022       Progress Note  Subjective  Chief Complaint  Annual Exam  HPI  Patient presents for annual CPE.  Diet: she eats a healthy breakfast and lunch at work and  dinner is cooked by her mother  Exercise:  she resumed regular activity  Last Eye Exam: up to date  Last Dental Exam: up to date   [employer] Visit from 09/20/2022 in [facility]  AUDIT-C Score 0      Depression: Phq 9 is  negative    09/20/2022    7:39 AM 08/23/2022    8:34 AM 02/20/2022    7:53 AM 09/18/2021    7:49 AM 09/05/2021    7:37 AM  Depression screen PHQ 2/9  Decreased Interest 0 0 0 0 0  Down, Depressed, Hopeless 0 0 0 0 0  PHQ - 2 Score 0 0 0 0 0  Altered sleeping 0 0 0 0 0  Tired, decreased energy 0 0 0 0 0  Change in appetite 0 0 0 0 0  Feeling bad or failure about yourself  0 0 0 0 0  Trouble concentrating 0 0 0 0 0  Moving slowly or fidgety/restless 0 0 0 0 0  Suicidal thoughts 0 0 0 0 0  PHQ-9 Score 0 0 0 0 0  Difficult doing work/chores  Not difficult at all      Hypertension: BP Readings from Last 3 Encounters:  09/20/22 132/82  08/23/22 138/86  02/20/22 132/86   Obesity: Wt Readings from Last 3 Encounters:  09/20/22 226 lb (102.5 kg)  08/23/22 225 lb 11.2 oz (102.4 kg)  02/20/22 216 lb (98 kg)   BMI Readings from Last 3 Encounters:  09/20/22 42.70 kg/m  08/23/22 42.65 kg/m  02/20/22 40.81 kg/m     Vaccines:   HPV: up to date Tdap: up to date Shingrix: N/A Pneumonia: N/A Flu: up to date COVID-54: up to date   Hep C Screening: 02/19/19 STD testing and prevention (HIV/chl/gon/syphilis): 01/26/15 Intimate partner violence: negative screen  Sexual History : never sexually active  Menstrual History/LMP/Abnormal Bleeding: LMP 09/09/2022, regular, lasts 4 days, only heavy for one day  Discussed importance of follow up if any post-menopausal bleeding: N/A  Incontinence  Symptoms: negative for symptoms   Breast cancer:  - Last Mammogram: 04/25/2022 - BRCA gene screening: N/A  Osteoporosis Prevention : Discussed high calcium and vitamin D supplementation, weight bearing exercises Bone density: N/A   Cervical cancer screening: 09/18/21  Skin cancer: Discussed monitoring for atypical lesions  Colorectal cancer: N/A   Lung cancer:  Low Dose CT Chest recommended if Age 67-80 years, 20 pack-year currently smoking OR have quit w/in 15years. Patient does not qualify for screen   ECG: 02/19/19  Advanced Care Planning: A voluntary discussion about advance care planning including the explanation and discussion of advance directives.  Discussed health care proxy and Living will, and the patient was able to identify a health care proxy as parents .  Patient does not have a living will and power of attorney of health care   Lipids: Lab Results  Component Value Date   CHOL 172 02/20/2022   CHOL 172 03/05/2021   CHOL 164 03/02/2020   Lab Results  Component Value Date   HDL 42 (L) 02/20/2022   HDL 46 (L) 03/05/2021   HDL 39 (L) 03/02/2020   Lab  Results  Component Value Date   LDLCALC 101 (H) 02/20/2022   LDLCALC 104 (H) 03/05/2021   LDLCALC 105 (H) 03/02/2020   Lab Results  Component Value Date   TRIG 176 (H) 02/20/2022   TRIG 122 03/05/2021   TRIG 104 03/02/2020   Lab Results  Component Value Date   CHOLHDL 4.1 02/20/2022   CHOLHDL 3.7 03/05/2021   CHOLHDL 4.2 03/02/2020   No results found for: "LDLDIRECT"  Glucose: Glucose, Bld  Date Value Ref Range Status  02/20/2022 97 65 - 99 mg/dL Final    Comment:    .            Fasting reference interval .   03/05/2021 81 65 - 99 mg/dL Final    Comment:    .            Fasting reference interval .   03/02/2020 84 65 - 99 mg/dL Final    Comment:    .            Fasting reference interval .     Patient Active Problem List   Diagnosis Date Noted   Morbid obesity with body mass index (BMI)  of 40.0 to 44.9 in adult [facility]) 09/05/2021   Acanthosis nigricans 12/31/2014   Essential (primary) hypertension 02/72/5366   Metabolic syndrome 44/09/4740   Obesity 12/31/2014   Perennial allergic rhinitis 12/31/2014   Vitamin D deficiency 12/31/2014    Past Surgical History:  Procedure Laterality Date   MOUTH SURGERY      Family History  Problem Relation Age of Onset   Hypertension Mother    Arthritis Mother    Hypertension Father    Diabetes Paternal Grandmother    Breast cancer Maternal Aunt 68   Breast cancer Other 14       maunt    Social History   Socioeconomic History   Marital status: Single    Spouse name: Not on file   Number of children: 0   Years of education: 14   Highest education level: Associate degree: academic program  Occupational History   Occupation: Web designer   Tobacco Use   Smoking status: Never   Smokeless tobacco: Never  Vaping Use   Vaping Use: Never used  Substance and Sexual Activity   Alcohol use: Yes    Alcohol/week: 0.0 standard drinks of alcohol    Comment: Socially   Drug use: No   Sexual activity: Not Currently    Birth control/protection: Abstinence  Other Topics Concern   Not on file  Social History Narrative   Lives with mother, father ( [place] ) and two sisters.   Social Determinants of Health   Financial Resource Strain: Low Risk  (09/20/2022)   Overall Financial Resource Strain (CARDIA)    Difficulty of Paying Living Expenses: Not hard at all  Food Insecurity: No Food Insecurity (09/20/2022)   Hunger Vital Sign    Worried About Running Out of Food in the Last Year: Never true    Ran Out of Food in the Last Year: Never true  Transportation Needs: No Transportation Needs (09/20/2022)   PRAPARE - Hydrologist (Medical): No    Lack of Transportation (Non-Medical): No  Physical Activity: Insufficiently Active (09/20/2022)   Exercise Vital Sign    Days of Exercise per Week: 3 days     Minutes of Exercise per Session: 20 min  Stress: No Stress Concern Present (09/20/2022)   [employer]  [employer] - Occupational Stress Questionnaire    Feeling of Stress : Not at all  Social Connections: Socially Isolated (09/20/2022)   Social Connection and Isolation Panel [NHANES]    Frequency of Communication with Friends and Family: Once a week    Frequency of Social Gatherings with Friends and Family: Once a week    Attends Religious Services: More than 4 times per year    Active Member of [employer] or Organizations: No    Attends Archivist Meetings: Never    Marital Status: Never married  Intimate Partner Violence: Not At Risk (09/20/2022)   Humiliation, Afraid, Rape, and Kick questionnaire    Fear of Current or Ex-Partner: No    Emotionally Abused: No    Physically Abused: No    Sexually Abused: No     Current Outpatient Medications:    Cholecalciferol (VITAMIN D) 2000 UNITS tablet, Take 1 tablet by mouth daily., Disp: , Rfl:    fluticasone (FLONASE) 50 MCG/ACT nasal spray, Place 2 sprays into both nostrils daily., Disp: 48 mL, Rfl: 0   loratadine (CLARITIN) 10 MG tablet, Take 1 tablet (10 mg total) by mouth daily., Disp: 100 tablet, Rfl: 2   losartan-hydrochlorothiazide (HYZAAR) 50-12.5 MG tablet, Take 1 tablet by mouth daily., Disp: 90 tablet, Rfl: 1   montelukast (SINGULAIR) 10 MG tablet, Take 1 tablet (10 mg total) by mouth daily., Disp: 90 tablet, Rfl: 1   Multiple Vitamin tablet, Take 1 tablet by mouth daily., Disp: , Rfl:    Olopatadine HCl 0.2 % SOLN, Apply 1 drop to eye daily., Disp: 2.5 mL, Rfl: 5  No Known Allergies   ROS  Constitutional: Negative for fever or weight change.  Respiratory: Negative for cough and shortness of breath.   Cardiovascular: Negative for chest pain or palpitations.  Gastrointestinal: Negative for abdominal pain, no bowel changes.  Musculoskeletal: Negative for gait problem or joint swelling.  Skin: Negative for  rash.  Neurological: Negative for dizziness or headache.  No other specific complaints in a complete review of systems (except as listed in HPI above).   Objective  Vitals:   09/20/22 0739  BP: 132/82  Pulse: 91  Resp: 16  SpO2: 98%  Weight: 226 lb (102.5 kg)  Height: 5\' 1"  (1.549 m)    Body mass index is 42.7 kg/m.  Physical Exam  Constitutional: Patient appears well-developed and well-nourished. No distress.  HENT: Head: Normocephalic and atraumatic. Ears: B TMs ok, no erythema or effusion; Nose: Nose normal. Mouth/Throat: Oropharynx is clear and moist. No oropharyngeal exudate.  Eyes: Conjunctivae and EOM are normal. Pupils are equal, round, and reactive to light. No scleral icterus.  Neck: Normal range of motion. Neck supple. No JVD present. No thyromegaly present.  Cardiovascular: Normal rate, regular rhythm and normal heart sounds.  No murmur heard. No BLE edema. Pulmonary/Chest: Effort normal and breath sounds normal. No respiratory distress. Abdominal: Soft. Bowel sounds are normal, no distension. There is no tenderness. no masses Breast: no lumps or masses, no nipple discharge or rashes FEMALE GENITALIA:  Not done  RECTAL: not done  Musculoskeletal: Normal range of motion, no joint effusions. No gross deformities Neurological: he is alert and oriented to person, place, and time. No cranial nerve deficit. Coordination, balance, strength, speech and gait are normal.  Skin: Skin is warm and dry. No rash noted. No erythema.  Psychiatric: Patient has a normal mood and affect. behavior is normal. Judgment and thought content normal.   Fall Risk:  09/20/2022    7:39 AM 08/23/2022    8:34 AM 02/20/2022    7:53 AM 09/18/2021    7:49 AM 09/05/2021    7:37 AM  Fall Risk   Falls in the past year? 0 0 0 0 0  Number falls in past yr: 0 0 0 0 0  Injury with Fall? 0 0 0 0 0  Risk for fall due to : No Fall Risks No Fall Risks No Fall Risks No Fall Risks No Fall Risks  Follow up  Falls prevention discussed Falls prevention discussed;Education provided;Falls evaluation completed Falls prevention discussed Falls prevention discussed Falls prevention discussed     Functional Status Survey: Is the patient deaf or have difficulty hearing?: No Does the patient have difficulty seeing, even when wearing glasses/contacts?: No Does the patient have difficulty concentrating, remembering, or making decisions?: No Does the patient have difficulty walking or climbing stairs?: No Does the patient have difficulty dressing or bathing?: No Does the patient have difficulty doing errands alone such as visiting a doctor's office or shopping?: No   Assessment & Plan  1. Well adult exam   2. Breast cancer screening by mammogram  - MM 3D SCREENING MAMMOGRAM BILATERAL BREAST; Future    -USPSTF grade A and B recommendations reviewed with patient; age-appropriate recommendations, preventive care, screening tests, etc discussed and encouraged; healthy living encouraged; see AVS for patient education given to patient -Discussed importance of 150 minutes of physical activity weekly, eat two servings of fish weekly, eat one serving of tree nuts ( cashews, pistachios, pecans, almonds.[employer]land [employer]) every other day, eat 6 servings of fruit/vegetables daily and drink plenty of water and avoid sweet beverages.   -Reviewed Health Maintenance: Yes.

## 2022-09-20 ENCOUNTER — Encounter: Payer: Self-pay | Admitting: Family Medicine

## 2022-09-20 ENCOUNTER — Ambulatory Visit (INDEPENDENT_AMBULATORY_CARE_PROVIDER_SITE_OTHER): Payer: BC Managed Care – PPO | Admitting: Family Medicine

## 2022-09-20 VITALS — BP 122/78 | HR 91 | Resp 16 | Ht 61.0 in | Wt 226.0 lb

## 2022-09-20 DIAGNOSIS — Z0000 Encounter for general adult medical examination without abnormal findings: Secondary | ICD-10-CM | POA: Diagnosis not present

## 2022-09-20 DIAGNOSIS — Z1231 Encounter for screening mammogram for malignant neoplasm of breast: Secondary | ICD-10-CM | POA: Diagnosis not present

## 2022-10-29 ENCOUNTER — Other Ambulatory Visit: Payer: Self-pay | Admitting: Family Medicine

## 2022-10-29 DIAGNOSIS — I10 Essential (primary) hypertension: Secondary | ICD-10-CM

## 2022-11-04 ENCOUNTER — Other Ambulatory Visit: Payer: Self-pay | Admitting: Family Medicine

## 2022-12-30 NOTE — Progress Notes (Unsigned)
Name: Kaitlin Schroeder   MRN: [id number]    DOB: March 09, 1981   Date:12/31/2022       Progress Note  Subjective  Chief Complaint  Nausea  HPI  HTN: she was taking  hydrochlorothiazide but bp started to go up and we added Losartan  Feb 2024  bp has been better controlled, no side effects of medications No complaints of chest pain, edema, palpitation or SOB. She has been checking her blood pressure at home, 120's/80's. We will continue current regiment    Perennial Allergic Rhinitis: taking Loratadine  and singulair and nasal spray but not daily. Uses Olopatadine occasionally for eye symptoms, usually worse in the mornings - watery. She has noticed some frontal pressure    Adult Obesity: insurance stopped paying for Qsymia, her original weight was 218 lbs on 04/2013.  She was down to 199 lbs in December 2016 but medication had to be switched to [facility] around Feb 2017 after a gap on medication and she is up to [employer] stopped paying for Contrave because she was not losing weight. She was given Victoza Dec 2017 but she did not lose much weight, started on Ozempic 09/2016 at 213 lbs. Weight back in 02/2020 ws 210 lbs, she decided to stop Ozempic mid Nov 22 , she gained some weight , she was 219 lbs beginning of Feb 22 when she joined [employer].  Currently resumed physical activity January 2024 she is has been walking 30 minutes or doing Zumba class for about 30 minutes three days a week. She is also doing some exercises in her pool . She has Weight Watchers app but has not been using it    Metabolic Syndrome: hgbA1C was 4.0% in  02/2022.  She denies polyphagia, polydipsia or polyuria. She resumed regular physical activity January 2024 , discussed resuming Weight Watchers    GERD: nausea - similar episode last year -  no abdominal pain, feels like something is in her throat, she has been taking Famotidine and after a few days symptoms improved. No heartburn. No change in bowel movements.  Denies vomiting. H. Pylori and lipase negative last year     Patient Active Problem List   Diagnosis Date Noted   Morbid obesity with body mass index (BMI) of 40.0 to 44.9 in adult [facility]) 09/05/2021   Acanthosis nigricans 12/31/2014   Essential (primary) hypertension 12/31/2014   Metabolic syndrome 12/31/2014   Obesity 12/31/2014   Perennial allergic rhinitis 12/31/2014   Vitamin D deficiency 12/31/2014    Past Surgical History:  Procedure Laterality Date   MOUTH SURGERY      Family History  Problem Relation Age of Onset   Hypertension Mother    Arthritis Mother    Hypertension Father    Diabetes Paternal Grandmother    Breast cancer Maternal Aunt 61   Breast cancer Other 62       maunt    Social History   Tobacco Use   Smoking status: Never   Smokeless tobacco: Never  Substance Use Topics   Alcohol use: Yes    Alcohol/week: 0.0 standard drinks of alcohol    Comment: Socially     Current Outpatient Medications:    Cholecalciferol (VITAMIN D) 2000 UNITS tablet, Take 1 tablet by mouth daily., Disp: , Rfl:    fluticasone (FLONASE) 50 MCG/ACT nasal spray, SPRAY 2 SPRAYS INTO EACH NOSTRIL EVERY DAY, Disp: 48 mL, Rfl: 0   loratadine (CLARITIN) 10 MG tablet, Take 1 tablet (10 mg total) by mouth  daily., Disp: 100 tablet, Rfl: 2   losartan-hydrochlorothiazide (HYZAAR) 50-12.5 MG tablet, Take 1 tablet by mouth daily., Disp: 90 tablet, Rfl: 1   montelukast (SINGULAIR) 10 MG tablet, Take 1 tablet (10 mg total) by mouth daily., Disp: 90 tablet, Rfl: 1   Multiple Vitamin tablet, Take 1 tablet by mouth daily., Disp: , Rfl:    Olopatadine HCl 0.2 % SOLN, Apply 1 drop to eye daily., Disp: 2.5 mL, Rfl: 5  No Known Allergies  I personally reviewed active problem list, medication list, allergies, family history, social history, health maintenance with the patient/caregiver today.   ROS  Constitutional: Negative for fever or weight change.  Respiratory: Negative for cough and  shortness of breath.   Cardiovascular: Negative for chest pain or palpitations.  Gastrointestinal: Negative for abdominal pain, no bowel changes.  Musculoskeletal: Negative for gait problem or joint swelling.  Skin: Negative for rash.  Neurological: Negative for dizziness or headache.  No other specific complaints in a complete review of systems (except as listed in HPI above).   Objective  Vitals:   12/31/22 0857  BP: 130/78  Pulse: 88  Resp: 18  Temp: 98.3 F (36.8 C)  TempSrc: Oral  SpO2: 100%  Weight: 225 lb 11.2 oz (102.4 kg)  Height: 5\' 1"  (1.549 m)    Body mass index is 42.65 kg/m.  Physical Exam  Constitutional: Patient appears well-developed and well-nourished. Obese  No distress.  HEENT: head atraumatic, normocephalic, pupils equal and reactive to light, neck supple Cardiovascular: Normal rate, regular rhythm and normal heart sounds.  No murmur heard. No BLE edema. Pulmonary/Chest: Effort normal and breath sounds normal. No respiratory distress. Abdominal: Soft.  There is no tenderness. Psychiatric: Patient has a normal mood and affect. behavior is normal. Judgment and thought content normal.   PHQ2/9:    09/20/2022    7:39 AM 08/23/2022    8:34 AM 02/20/2022    7:53 AM 09/18/2021    7:49 AM 09/05/2021    7:37 AM  Depression screen PHQ 2/9  Decreased Interest 0 0 0 0 0  Down, Depressed, Hopeless 0 0 0 0 0  PHQ - 2 Score 0 0 0 0 0  Altered sleeping 0 0 0 0 0  Tired, decreased energy 0 0 0 0 0  Change in appetite 0 0 0 0 0  Feeling bad or failure about yourself  0 0 0 0 0  Trouble concentrating 0 0 0 0 0  Moving slowly or fidgety/restless 0 0 0 0 0  Suicidal thoughts 0 0 0 0 0  PHQ-9 Score 0 0 0 0 0  Difficult doing work/chores  Not difficult at all       phq 9 is negative   Fall Risk:    09/20/2022    7:39 AM 08/23/2022    8:34 AM 02/20/2022    7:53 AM 09/18/2021    7:49 AM 09/05/2021    7:37 AM  Fall Risk   Falls in the past year? 0 0 0 0 0  Number falls  in past yr: 0 0 0 0 0  Injury with Fall? 0 0 0 0 0  Risk for fall due to : No Fall Risks No Fall Risks No Fall Risks No Fall Risks No Fall Risks  Follow up Falls prevention discussed Falls prevention discussed;Education provided;Falls evaluation completed Falls prevention discussed Falls prevention discussed Falls prevention discussed      Functional Status Survey: Is the patient deaf or have difficulty hearing?:  No Does the patient have difficulty seeing, even when wearing glasses/contacts?: No Does the patient have difficulty concentrating, remembering, or making decisions?: No Does the patient have difficulty walking or climbing stairs?: No Does the patient have difficulty dressing or bathing?: No Does the patient have difficulty doing errands alone such as visiting a doctor's office or shopping?: No    Assessment & Plan  1. Essential (primary) hypertension  Bp has improved   2. Vitamin D deficiency  Taking vitamin D   3. Morbid obesity with body mass index (BMI) of 40.0 to 44.9 in adult [facility])  Resume the weight watchers app   4. Metabolic syndrome   5. Perennial allergic rhinitis  Stable, frontal headaches likely unrelated to sinus pressure, advised to write down symptoms /headache diary   6. GERD without esophagitis  - omeprazole (PRILOSEC) 20 MG capsule; Take 1 capsule (20 mg total) by mouth 2 (two) times daily before a meal.  Dispense: 180 capsule; Refill: 0

## 2022-12-31 ENCOUNTER — Ambulatory Visit (INDEPENDENT_AMBULATORY_CARE_PROVIDER_SITE_OTHER): Payer: BC Managed Care – PPO | Admitting: Family Medicine

## 2022-12-31 ENCOUNTER — Encounter: Payer: Self-pay | Admitting: Family Medicine

## 2022-12-31 VITALS — BP 130/78 | HR 88 | Temp 98.3°F | Resp 18 | Ht 61.0 in | Wt 225.7 lb

## 2022-12-31 DIAGNOSIS — E8881 Metabolic syndrome: Secondary | ICD-10-CM

## 2022-12-31 DIAGNOSIS — K219 Gastro-esophageal reflux disease without esophagitis: Secondary | ICD-10-CM

## 2022-12-31 DIAGNOSIS — J3089 Other allergic rhinitis: Secondary | ICD-10-CM

## 2022-12-31 DIAGNOSIS — I10 Essential (primary) hypertension: Secondary | ICD-10-CM | POA: Diagnosis not present

## 2022-12-31 DIAGNOSIS — E559 Vitamin D deficiency, unspecified: Secondary | ICD-10-CM | POA: Diagnosis not present

## 2022-12-31 DIAGNOSIS — Z6841 Body Mass Index (BMI) 40.0 and over, adult: Secondary | ICD-10-CM

## 2022-12-31 MED ORDER — OMEPRAZOLE 20 MG PO CPDR: 20.0000 mg | DELAYED_RELEASE_CAPSULE | Freq: Two times a day (BID) | ORAL | 0 refills | Status: DC

## 2022-12-31 NOTE — Patient Instructions (Signed)
Food Choices for Gastroesophageal Reflux Disease, Adult When you have gastroesophageal reflux disease (GERD), the foods you eat and your eating habits are very important. Choosing the right foods can help ease the discomfort of GERD. Consider working with a dietitian to help you make healthy food choices. What are tips for following this plan? Reading food labels Look for foods that are low in saturated fat. Foods that have less than 5% of daily value (DV) of fat and 0 g of trans fats may help with your symptoms. Cooking Cook foods using methods other than frying. This may include baking, steaming, grilling, or broiling. These are all methods that do not need a lot of fat for cooking. To add flavor, try to use herbs that are low in spice and acidity. Meal planning  Choose healthy foods that are low in fat, such as fruits, vegetables, whole grains, low-fat dairy products, lean meats, fish, and poultry. Eat frequent, small meals instead of three large meals each day. Eat your meals slowly, in a relaxed setting. Avoid bending over or lying down until 2-3 hours after eating. Limit high-fat foods such as fatty meats or fried foods. Limit your intake of fatty foods, such as oils, butter, and shortening. Avoid the following as told by your health care provider: Foods that cause symptoms. These may be different for different people. Keep a food diary to keep track of foods that cause symptoms. Alcohol. Drinking large amounts of liquid with meals. Eating meals during the 2-3 hours before bed. Lifestyle Maintain a healthy weight. Ask your health care provider what weight is healthy for you. If you need to lose weight, work with your health care provider to do so safely. Exercise for at least 30 minutes on 5 or more days each week, or as told by your health care provider. Avoid wearing clothes that fit tightly around your waist and chest. Do not use any products that contain nicotine or tobacco. These  products include cigarettes, chewing tobacco, and vaping devices, such as e-cigarettes. If you need help quitting, ask your health care provider. Sleep with the head of your bed raised. Use a wedge under the mattress or blocks under the bed frame to raise the head of the bed. Chew sugar-free gum after mealtimes. What foods should I eat?  Eat a healthy, well-balanced diet of fruits, vegetables, whole grains, low-fat dairy products, lean meats, fish, and poultry. Each person is different. Foods that may trigger symptoms in one person may not trigger any symptoms in another person. Work with your health care provider to identify foods that are safe for you. The items listed above may not be a complete list of recommended foods and beverages. Contact a dietitian for more information. What foods should I avoid? Limiting some of these foods may help manage the symptoms of GERD. Everyone is different. Consult a dietitian or your health care provider to help you identify the exact foods to avoid, if any. Fruits Any fruits prepared with added fat. Any fruits that cause symptoms. For some people this may include citrus fruits, such as oranges, grapefruit, pineapple, and lemons. Vegetables Deep-fried vegetables. French fries. Any vegetables prepared with added fat. Any vegetables that cause symptoms. For some people, this may include tomatoes and tomato products, chili peppers, onions and garlic, and horseradish. Grains Pastries or quick breads with added fat. Meats and other proteins High-fat meats, such as fatty beef or pork, hot dogs, ribs, ham, sausage, salami, and bacon. Fried meat or protein, including   fried fish and fried chicken. Nuts and nut butters, in large amounts. Dairy Whole milk and chocolate milk. Sour cream. Cream. Ice cream. Cream cheese. Milkshakes. Fats and oils Butter. Margarine. Shortening. Ghee. Beverages Coffee and tea, with or without caffeine. Carbonated beverages. Sodas. Energy  drinks. Fruit juice made with acidic fruits, such as orange or grapefruit. Tomato juice. Alcoholic drinks. Sweets and desserts Chocolate and cocoa. Donuts. Seasonings and condiments Pepper. Peppermint and spearmint. Added salt. Any condiments, herbs, or seasonings that cause symptoms. For some people, this may include curry, hot sauce, or vinegar-based salad dressings. The items listed above may not be a complete list of foods and beverages to avoid. Contact a dietitian for more information. Questions to ask your health care provider Diet and lifestyle changes are usually the first steps that are taken to manage symptoms of GERD. If diet and lifestyle changes do not improve your symptoms, talk with your health care provider about taking medicines. Where to find more information [employer]: aboutgerd.org Summary When you have gastroesophageal reflux disease (GERD), food and lifestyle choices may be very helpful in easing the discomfort of GERD. Eat frequent, small meals instead of three large meals each day. Eat your meals slowly, in a relaxed setting. Avoid bending over or lying down until 2-3 hours after eating. Limit high-fat foods such as fatty meats or fried foods. This information is not intended to replace advice given to you by your health care provider. Make sure you discuss any questions you have with your health care provider. Document Revised: 01/10/2020 Document Reviewed: 01/10/2020 Elsevier Patient Education  2024 [employer].  

## 2023-01-10 ENCOUNTER — Encounter: Payer: Self-pay | Admitting: Family Medicine

## 2023-01-13 ENCOUNTER — Other Ambulatory Visit: Payer: Self-pay | Admitting: Family Medicine

## 2023-01-13 DIAGNOSIS — K219 Gastro-esophageal reflux disease without esophagitis: Secondary | ICD-10-CM

## 2023-02-07 ENCOUNTER — Other Ambulatory Visit: Payer: Self-pay | Admitting: Family Medicine

## 2023-02-14 ENCOUNTER — Other Ambulatory Visit: Payer: Self-pay | Admitting: Family Medicine

## 2023-02-14 DIAGNOSIS — I10 Essential (primary) hypertension: Secondary | ICD-10-CM

## 2023-02-15 ENCOUNTER — Other Ambulatory Visit: Payer: Self-pay | Admitting: Family Medicine

## 2023-02-15 DIAGNOSIS — J3089 Other allergic rhinitis: Secondary | ICD-10-CM

## 2023-02-20 NOTE — Progress Notes (Signed)
Name: Kaitlin Schroeder   MRN: [id number]    DOB: Oct 09, 1980   Date:02/21/2023       Progress Note  Subjective  Chief Complaint  Follow Up  HPI  HTN: she was taking  hydrochlorothiazide but bp started to go up and we added Losartan  Feb 2024  bp has been better controlled, no side effects of medications No complaints of chest pain, edema, palpitation or SOB. She has been checking her blood pressure at home, Low 120 's /80's . We will continue current dose    Perennial Allergic Rhinitis: taking Loratadine  and singulair and nasal spray but not daily. Uses Olopatadine occasionally for eye symptoms, usually worse in the mornings - watery.   Symptoms are stable    Adult Obesity: insurance stopped paying for Qsymia, her original weight was 218 lbs on 04/2013.  She was down to 199 lbs in December 2016 but medication had to be switched to [facility] around Feb 2017 after a gap on medication and she is up to [employer] stopped paying for Contrave because she was not losing weight. She was given Victoza Dec 2017 but she did not lose much weight, started on Ozempic 09/2016 at 213 lbs. Weight back in 02/2020 ws 210 lbs, she decided to stop Ozempic mid Nov 22 , she gained some weight , she was 219 lbs beginning of Feb 22 when she joined [employer].  Currently resumed physical activity January 2024 she is has been walking 30 minutes or doing Zumba class for about 30 minutes three days a week. She is also doing some exercises in her pool . She has Weight Watchers  App that has not been used. Weight is down 2 lbs    Metabolic Syndrome: hgbA1C was 4.0% in  02/2022.  She denies polyphagia, polydipsia or polyuria. She resumed regular physical activity January 2024 , she really needs to try Weight Watchers.   GERD: nausea - similar episode last year -  no abdominal pain, feels like something is in her throat,. No heartburn. No change in bowel movements. Denies vomiting. H. Pylori and lipase negative last year . We  gave her Omeprazole but did not seem to help so she stopped and is taking Famotidine otc at night and no problems over the past two weeks. She also knows what to avoid but not always compliant. She was referred to GI but since symptoms resolved she will post pone the visit for now   Patient Active Problem List   Diagnosis Date Noted   Morbid obesity with body mass index (BMI) of 40.0 to 44.9 in adult [facility]) 09/05/2021   Acanthosis nigricans 12/31/2014   Essential (primary) hypertension 12/31/2014   Metabolic syndrome 12/31/2014   Obesity 12/31/2014   Perennial allergic rhinitis 12/31/2014   Vitamin D deficiency 12/31/2014    Past Surgical History:  Procedure Laterality Date   MOUTH SURGERY      Family History  Problem Relation Age of Onset   Hypertension Mother    Arthritis Mother    Hypertension Father    Diabetes Paternal Grandmother    Breast cancer Maternal Aunt 14   Breast cancer Other 20       maunt    Social History   Tobacco Use   Smoking status: Never   Smokeless tobacco: Never  Substance Use Topics   Alcohol use: Yes    Alcohol/week: 0.0 standard drinks of alcohol    Comment: Socially     Current Outpatient Medications:  Cholecalciferol (VITAMIN D) 2000 UNITS tablet, Take 1 tablet by mouth daily., Disp: , Rfl:    fluticasone (FLONASE) 50 MCG/ACT nasal spray, SPRAY 2 SPRAYS INTO EACH NOSTRIL EVERY DAY, Disp: 48 mL, Rfl: 0   loratadine (CLARITIN) 10 MG tablet, Take 1 tablet (10 mg total) by mouth daily., Disp: 100 tablet, Rfl: 2   losartan-hydrochlorothiazide (HYZAAR) 50-12.5 MG tablet, TAKE 1 TABLET BY MOUTH EVERY DAY, Disp: 30 tablet, Rfl: 0   montelukast (SINGULAIR) 10 MG tablet, TAKE 1 TABLET BY MOUTH EVERY DAY, Disp: 90 tablet, Rfl: 0   Multiple Vitamin tablet, Take 1 tablet by mouth daily., Disp: , Rfl:    Olopatadine HCl 0.2 % SOLN, Apply 1 drop to eye daily., Disp: 2.5 mL, Rfl: 5   omeprazole (PRILOSEC) 20 MG capsule, Take 1 capsule (20 mg total) by  mouth 2 (two) times daily before a meal., Disp: 180 capsule, Rfl: 0  No Known Allergies  I personally reviewed active problem list, medication list, allergies, family history, social history, health maintenance with the patient/caregiver today.   ROS  Ten systems reviewed and is negative except as mentioned in HPI    Objective  Vitals:   02/21/23 0812  BP: 128/82  Pulse: 92  Resp: 16  SpO2: 97%  Weight: 224 lb (101.6 kg)  Height: 5\' 1"  (1.549 m)    Body mass index is 42.32 kg/m.  Physical Exam  Constitutional: Patient appears well-developed and well-nourished. Obese  No distress.  HEENT: head atraumatic, normocephalic, pupils equal and reactive to light, neck supple, throat within normal limits Cardiovascular: Normal rate, regular rhythm and normal heart sounds.  No murmur heard. No BLE edema. Pulmonary/Chest: Effort normal and breath sounds normal. No respiratory distress. Abdominal: Soft.  There is no tenderness. Psychiatric: Patient has a normal mood and affect. behavior is normal. Judgment and thought content normal.    PHQ2/9:    02/21/2023    8:11 AM 09/20/2022    7:39 AM 08/23/2022    8:34 AM 02/20/2022    7:53 AM 09/18/2021    7:49 AM  Depression screen PHQ 2/9  Decreased Interest 0 0 0 0 0  Down, Depressed, Hopeless 0 0 0 0 0  PHQ - 2 Score 0 0 0 0 0  Altered sleeping 0 0 0 0 0  Tired, decreased energy 0 0 0 0 0  Change in appetite 0 0 0 0 0  Feeling bad or failure about yourself  0 0 0 0 0  Trouble concentrating 0 0 0 0 0  Moving slowly or fidgety/restless 0 0 0 0 0  Suicidal thoughts 0 0 0 0 0  PHQ-9 Score 0 0 0 0 0  Difficult doing work/chores   Not difficult at all      phq 9 is negative   Fall Risk:    02/21/2023    8:11 AM 09/20/2022    7:39 AM 08/23/2022    8:34 AM 02/20/2022    7:53 AM 09/18/2021    7:49 AM  Fall Risk   Falls in the past year? 0 0 0 0 0  Number falls in past yr: 0 0 0 0 0  Injury with Fall? 0 0 0 0 0  Risk for fall due to : No  Fall Risks No Fall Risks No Fall Risks No Fall Risks No Fall Risks  Follow up Falls prevention discussed Falls prevention discussed Falls prevention discussed;Education provided;Falls evaluation completed Falls prevention discussed Falls prevention discussed  Functional Status Survey: Is the patient deaf or have difficulty hearing?: No Does the patient have difficulty seeing, even when wearing glasses/contacts?: No Does the patient have difficulty concentrating, remembering, or making decisions?: No Does the patient have difficulty walking or climbing stairs?: No Does the patient have difficulty dressing or bathing?: No Does the patient have difficulty doing errands alone such as visiting a doctor's office or shopping?: No    Assessment & Plan  1. Morbid obesity with body mass index (BMI) of 40.0 to 44.9 in adult [facility])  She will try Weight Watchers   2. Metabolic syndrome  - Hemoglobin A1c  3. Essential (primary) hypertension  - losartan-hydrochlorothiazide (HYZAAR) 50-12.5 MG tablet; Take 1 tablet by mouth daily.  Dispense: 90 tablet; Refill: 1 - CBC with Differential/Platelet - COMPLETE METABOLIC PANEL WITH GFR  4. Vitamin D deficiency   5. Perennial allergic rhinitis  - montelukast (SINGULAIR) 10 MG tablet; Take 1 tablet (10 mg total) by mouth daily.  Dispense: 90 tablet; Refill: 1  6. GERD without esophagitis  - famotidine (PEPCID) 40 MG tablet; Take 1 tablet (40 mg total) by mouth at bedtime.  Dispense: 90 tablet; Refill: 1  7. Lipid screening  - Lipid panel

## 2023-02-21 ENCOUNTER — Ambulatory Visit (INDEPENDENT_AMBULATORY_CARE_PROVIDER_SITE_OTHER): Payer: BC Managed Care – PPO | Admitting: Family Medicine

## 2023-02-21 ENCOUNTER — Encounter: Payer: Self-pay | Admitting: Family Medicine

## 2023-02-21 VITALS — BP 128/82 | HR 92 | Resp 16 | Ht 61.0 in | Wt 224.0 lb

## 2023-02-21 DIAGNOSIS — Z13220 Encounter for screening for lipoid disorders: Secondary | ICD-10-CM

## 2023-02-21 DIAGNOSIS — K219 Gastro-esophageal reflux disease without esophagitis: Secondary | ICD-10-CM

## 2023-02-21 DIAGNOSIS — E559 Vitamin D deficiency, unspecified: Secondary | ICD-10-CM | POA: Diagnosis not present

## 2023-02-21 DIAGNOSIS — E8881 Metabolic syndrome: Secondary | ICD-10-CM | POA: Diagnosis not present

## 2023-02-21 DIAGNOSIS — I10 Essential (primary) hypertension: Secondary | ICD-10-CM | POA: Diagnosis not present

## 2023-02-21 DIAGNOSIS — Z6841 Body Mass Index (BMI) 40.0 and over, adult: Secondary | ICD-10-CM

## 2023-02-21 DIAGNOSIS — J3089 Other allergic rhinitis: Secondary | ICD-10-CM

## 2023-02-21 LAB — CBC WITH DIFFERENTIAL/PLATELET
Absolute Monocytes: 531 cells/uL (ref 200–950)
Basophils Absolute: 32 cells/uL (ref 0–200)
Basophils Relative: 0.5 %
Eosinophils Absolute: 160 cells/uL (ref 15–500)
Eosinophils Relative: 2.5 %
HCT: 39 % (ref 35.0–45.0)
Hemoglobin: 13.2 g/dL (ref 11.7–15.5)
Lymphs Abs: 2208 cells/uL (ref 850–3900)
MCH: 31.3 pg (ref 27.0–33.0)
MCHC: 33.8 g/dL (ref 32.0–36.0)
MCV: 92.4 fL (ref 80.0–100.0)
MPV: 10.4 fL (ref 7.5–12.5)
Monocytes Relative: 8.3 %
Neutro Abs: 3469 cells/uL (ref 1500–7800)
Neutrophils Relative %: 54.2 %
Platelets: 352 10*3/uL (ref 140–400)
RBC: 4.22 10*6/uL (ref 3.80–5.10)
RDW: 12.8 % (ref 11.0–15.0)
Total Lymphocyte: 34.5 %
WBC: 6.4 10*3/uL (ref 3.8–10.8)

## 2023-02-21 MED ORDER — MONTELUKAST SODIUM 10 MG PO TABS: 10.0000 mg | ORAL_TABLET | Freq: Every day | ORAL | 1 refills | Status: DC

## 2023-02-21 MED ORDER — LOSARTAN POTASSIUM-HCTZ 50-12.5 MG PO TABS: 1.0000 | ORAL_TABLET | Freq: Every day | ORAL | 1 refills | Status: DC

## 2023-02-21 MED ORDER — FAMOTIDINE 40 MG PO TABS: 40.0000 mg | ORAL_TABLET | Freq: Every day | ORAL | 1 refills | Status: DC

## 2023-02-22 ENCOUNTER — Encounter: Payer: Self-pay | Admitting: Family Medicine

## 2023-02-27 ENCOUNTER — Ambulatory Visit: Payer: BC Managed Care – PPO | Admitting: Physician Assistant

## 2023-03-29 ENCOUNTER — Other Ambulatory Visit: Payer: Self-pay | Admitting: Family Medicine

## 2023-03-29 DIAGNOSIS — K219 Gastro-esophageal reflux disease without esophagitis: Secondary | ICD-10-CM

## 2023-04-07 ENCOUNTER — Ambulatory Visit
Admission: RE | Admit: 2023-04-07 | Discharge: 2023-04-07 | Disposition: A | Discharge status: Home / Self Care | Payer: BC Managed Care – PPO | Source: Ambulatory Visit | Attending: Family Medicine | Admitting: Family Medicine

## 2023-04-07 DIAGNOSIS — Z1231 Encounter for screening mammogram for malignant neoplasm of breast: Secondary | ICD-10-CM | POA: Diagnosis present

## 2023-05-05 ENCOUNTER — Encounter: Payer: Self-pay | Admitting: Family Medicine

## 2023-05-08 IMAGING — MG MM DIGITAL SCREENING BILAT W/ TOMO AND CAD
8 series · 8 of 24 positions shown · non-contrast
Comparison: Previous exam(s).

CLINICAL DATA: Screening.

EXAM:
DIGITAL SCREENING BILATERAL MAMMOGRAM WITH TOMOSYNTHESIS AND CAD
TECHNIQUE: Bilateral screening digital craniocaudal and mediolateral oblique
mammograms were obtained. Bilateral screening digital breast
tomosynthesis was performed. The images were evaluated with
computer-aided detection.

[L CC synth-2D]
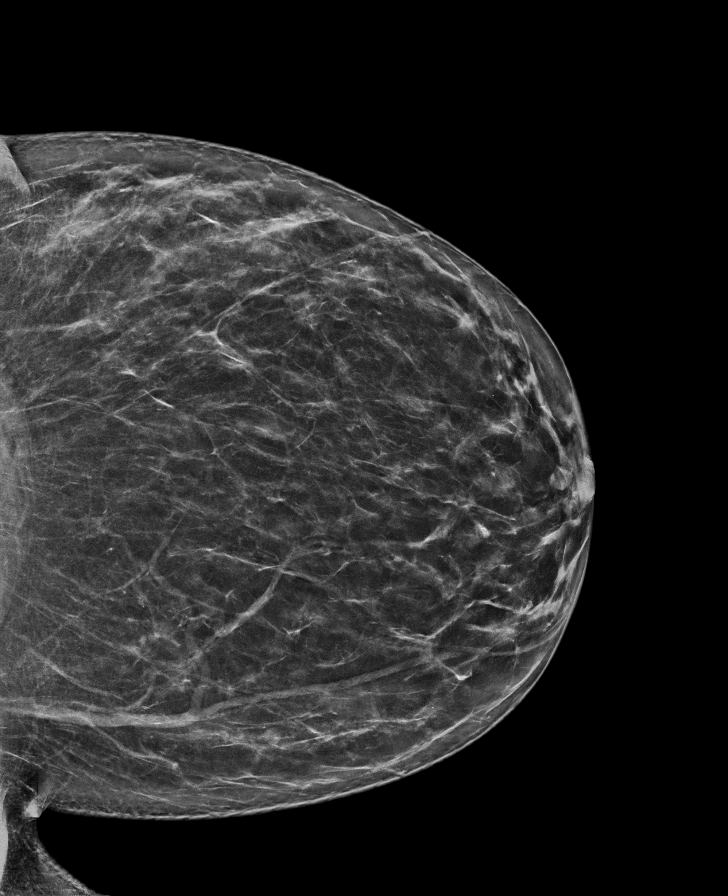

[L MLO synth-2D]
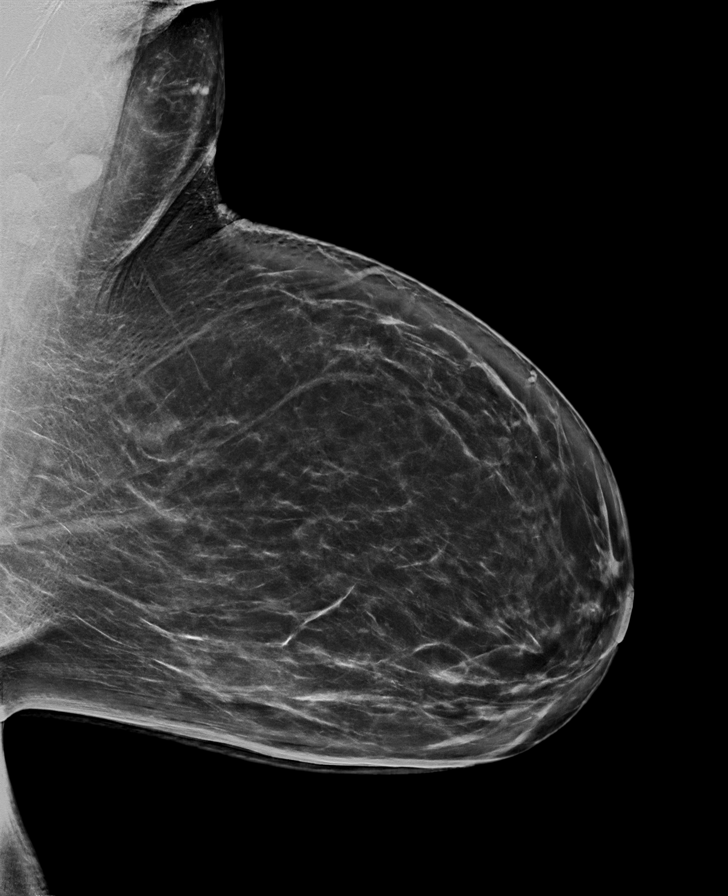

[R CC synth-2D]
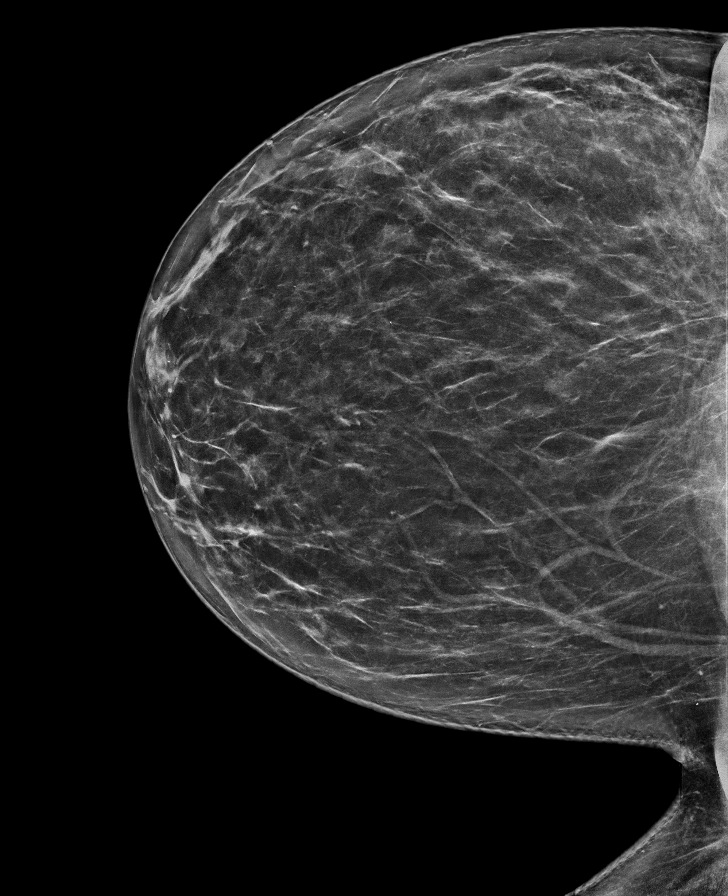

[R MLO synth-2D]
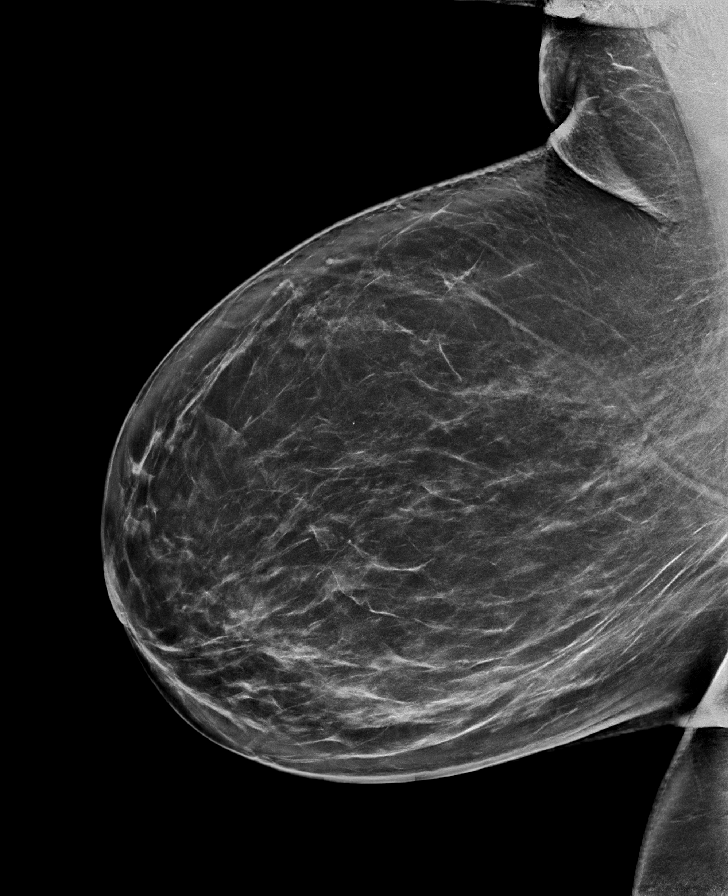

[R CC tomo · tomo slice 43/84.0]
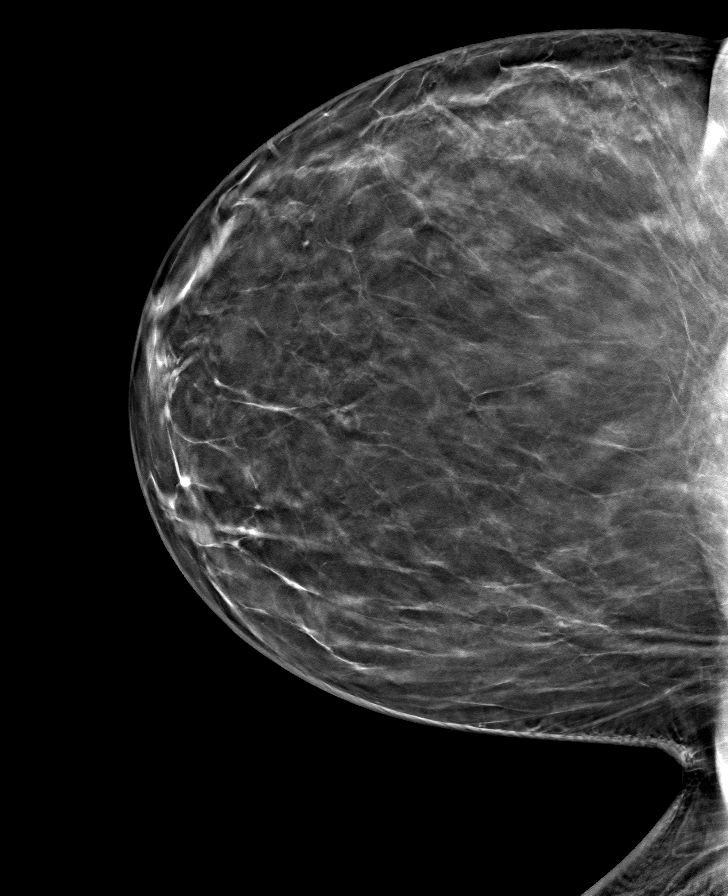

[L MLO tomo · tomo slice 49/98.0]
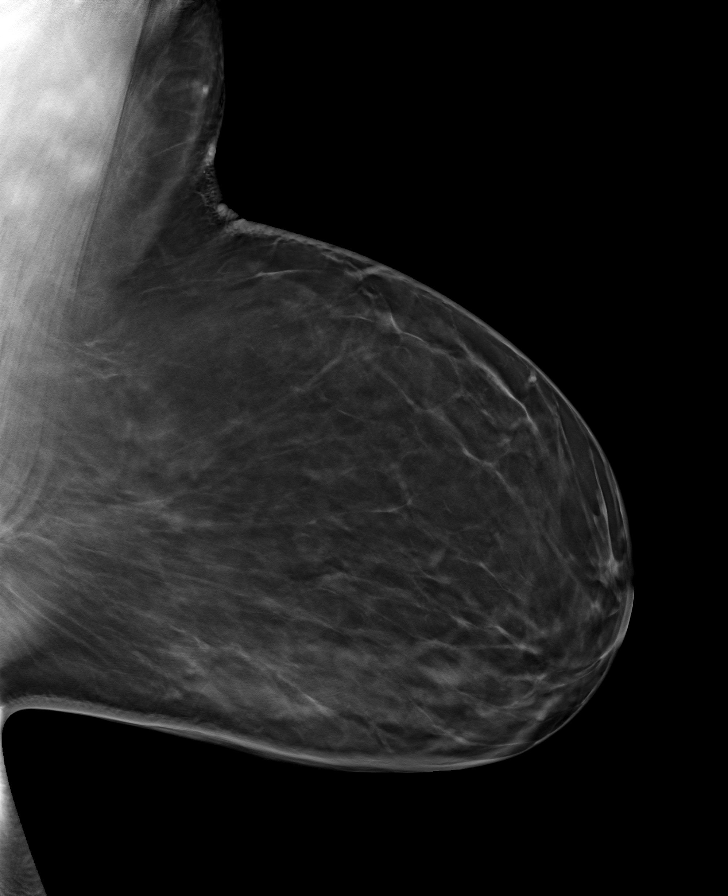

[R MLO tomo · tomo slice 49/98.0]
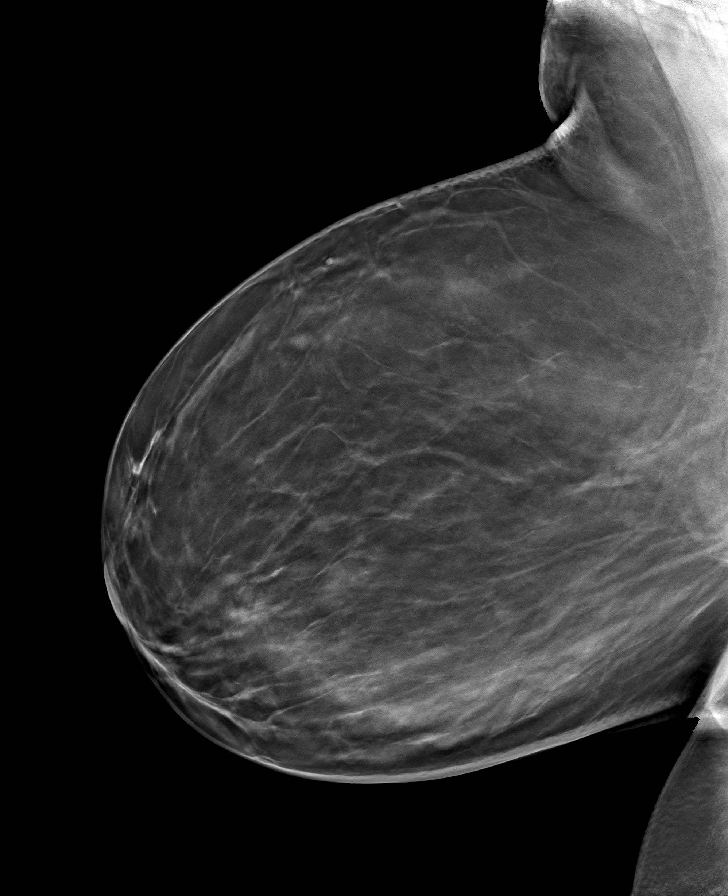

[L CC tomo · tomo slice 39/78.0]
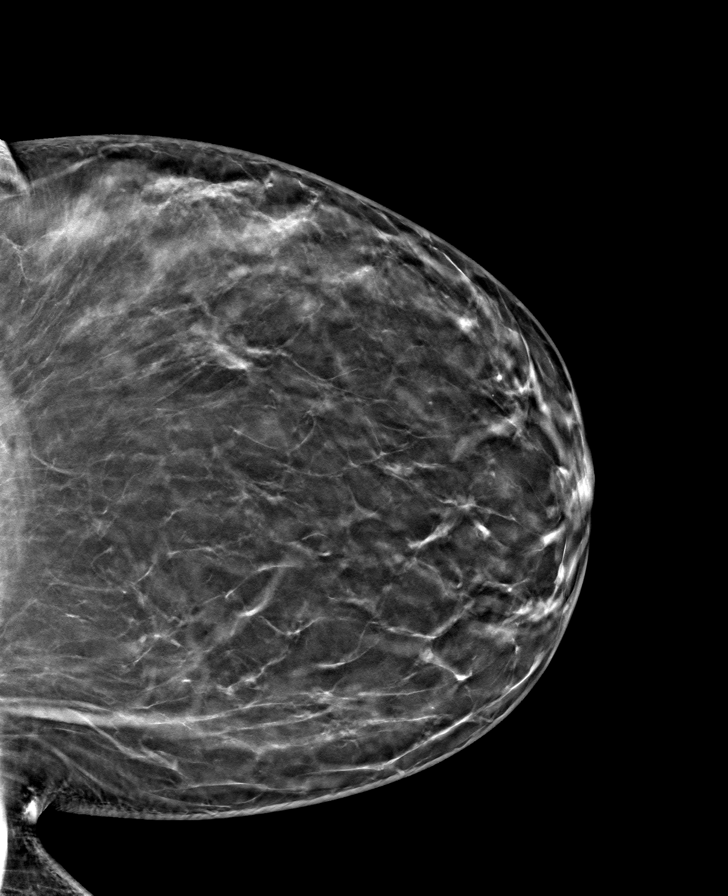

[8 of 24 positions shown; findings below may reference images not displayed]

ACR Breast Density Category b: There are scattered areas of
fibroglandular density.
FINDINGS: There are no findings suspicious for malignancy.
IMPRESSION: No mammographic evidence of malignancy. A result letter of this
screening mammogram will be mailed directly to the patient.

RECOMMENDATION:
Screening mammogram in one year. (Code:51-O-LD2)

BI-RADS CATEGORY  1: Negative.

## 2023-06-26 ENCOUNTER — Other Ambulatory Visit: Payer: Self-pay | Admitting: Family Medicine

## 2023-06-26 DIAGNOSIS — K219 Gastro-esophageal reflux disease without esophagitis: Secondary | ICD-10-CM

## 2023-07-15 ENCOUNTER — Encounter: Payer: Self-pay | Admitting: Family Medicine

## 2023-09-09 ENCOUNTER — Other Ambulatory Visit: Payer: Self-pay | Admitting: Family Medicine

## 2023-09-09 DIAGNOSIS — I10 Essential (primary) hypertension: Secondary | ICD-10-CM

## 2023-09-22 ENCOUNTER — Ambulatory Visit (INDEPENDENT_AMBULATORY_CARE_PROVIDER_SITE_OTHER): Payer: BC Managed Care – PPO | Admitting: Family Medicine

## 2023-09-22 ENCOUNTER — Encounter: Payer: Self-pay | Admitting: Family Medicine

## 2023-09-22 VITALS — BP 136/82 | HR 97 | Temp 98.6°F | Resp 16 | Ht 61.0 in | Wt 224.1 lb

## 2023-09-22 DIAGNOSIS — E8881 Metabolic syndrome: Secondary | ICD-10-CM

## 2023-09-22 DIAGNOSIS — I10 Essential (primary) hypertension: Secondary | ICD-10-CM | POA: Diagnosis not present

## 2023-09-22 DIAGNOSIS — J3089 Other allergic rhinitis: Secondary | ICD-10-CM

## 2023-09-22 DIAGNOSIS — Z6841 Body Mass Index (BMI) 40.0 and over, adult: Secondary | ICD-10-CM

## 2023-09-22 DIAGNOSIS — K219 Gastro-esophageal reflux disease without esophagitis: Secondary | ICD-10-CM

## 2023-09-22 DIAGNOSIS — E559 Vitamin D deficiency, unspecified: Secondary | ICD-10-CM

## 2023-09-22 MED ORDER — MONTELUKAST SODIUM 10 MG PO TABS: 10.0000 mg | ORAL_TABLET | Freq: Every day | ORAL | 1 refills | Status: DC

## 2023-09-22 MED ORDER — FLUTICASONE PROPIONATE 50 MCG/ACT NA SUSP: 2.0000 | Freq: Every day | NASAL | 0 refills | Status: DC

## 2023-09-22 NOTE — Progress Notes (Signed)
 Name: Kaitlin Schroeder   MRN: [id number]    DOB: 03-03-1981   Date:09/22/2023       Progress Note  Subjective  Chief Complaint  Chief Complaint  Patient presents with   Medical Management of Chronic Issues   HPI   HTN: she was taking  hydrochlorothiazide but bp started to go up and we added Losartan  Feb 2024  bp has been better controlled, no side effects of medications No complaints of chest pain, edema, palpitation or SOB. She states bp is controlled at home but not checking it on regular basis, today it was 146/84 when she arrived, we will recheck next visit during her CPE and if still high we will adjust dose to 50/25 and monitor.   Perennial Allergic Rhinitis: taking Loratadine  and singulair and nasal spray - flonase daily now and seems to help with symptoms. . Uses Olopatadine occasionally for eye symptoms, usually worse in the mornings - watery.      Adult Obesity: insurance stopped paying for Qsymia, her original weight was 218 lbs on 04/2013.  She was down to 199 lbs in December 2016 but medication had to be switched to [facility] around Feb 2017 after a gap on medication and she is up to [employer] stopped paying for Contrave because she was not losing weight. She was given Victoza Dec 2017 but she did not lose much weight, started on Ozempic 09/2016 at 213 lbs. Weight back in 02/2020 ws 210 lbs, she decided to stop Ozempic mid Nov 22 , she gained some weight , she was 219 lbs beginning of Feb 22 when she joined [employer].  Currently resumed physical activity January 2024 and resumed Weight Watchers January 2025 and is doing well, per her app down about 5 lbs   Metabolic Syndrome: last A1C was a little higher at 5.7 %  She denies polyphagia, polydipsia or polyuria. She resumed regular physical activity January 2024 , she joined [employer] 07/2023 her starting weight was 226.6 lbs and at home weight is down to 221.4 lbs    GERD: nausea - similar episode last year -  no  abdominal pain, feels like something is in her throat. No heartburn. Or change in bowel movements. Denies vomiting. H. Pylori and lipase negative last year . We gave her Omeprazole but did not seem to help so she stopped and is taking Famotidine otc at night and seems to help better. She was referred to GI but canceled since symptoms were better but now she thinks it would be best if she could go . Symptoms gets worse around her cycle    Patient Active Problem List   Diagnosis Date Noted   Morbid obesity with body mass index (BMI) of 40.0 to 44.9 in adult [facility]) 09/05/2021   Acanthosis nigricans 12/31/2014   Essential (primary) hypertension 12/31/2014   Metabolic syndrome 12/31/2014   Obesity 12/31/2014   Perennial allergic rhinitis 12/31/2014   Vitamin D deficiency 12/31/2014    Past Surgical History:  Procedure Laterality Date   MOUTH SURGERY      Family History  Problem Relation Age of Onset   Hypertension Mother    Arthritis Mother    Hypertension Father    Diabetes Paternal Grandmother    Breast cancer Maternal Aunt 69   Breast cancer Other 16       maunt    Social History   Tobacco Use   Smoking status: Never   Smokeless tobacco: Never  Substance Use Topics  Alcohol use: Yes    Alcohol/week: 0.0 standard drinks of alcohol    Comment: Socially     Current Outpatient Medications:    Cholecalciferol (VITAMIN D) 2000 UNITS tablet, Take 1 tablet by mouth daily., Disp: , Rfl:    famotidine (PEPCID) 40 MG tablet, TAKE 1 TABLET BY MOUTH EVERYDAY AT BEDTIME, Disp: 90 tablet, Rfl: 1   loratadine (CLARITIN) 10 MG tablet, Take 1 tablet (10 mg total) by mouth daily., Disp: 100 tablet, Rfl: 2   losartan-hydrochlorothiazide (HYZAAR) 50-12.5 MG tablet, TAKE 1 TABLET BY MOUTH EVERY DAY, Disp: 30 tablet, Rfl: 0   Multiple Vitamin tablet, Take 1 tablet by mouth daily., Disp: , Rfl:    Olopatadine HCl 0.2 % SOLN, Apply 1 drop to eye daily., Disp: 2.5 mL, Rfl: 5   fluticasone  (FLONASE) 50 MCG/ACT nasal spray, Place 2 sprays into both nostrils daily., Disp: 48 mL, Rfl: 0   montelukast (SINGULAIR) 10 MG tablet, Take 1 tablet (10 mg total) by mouth daily., Disp: 90 tablet, Rfl: 1  No Known Allergies  I personally reviewed active problem list, medication list, allergies, family history with the patient/caregiver today.   ROS  Ten systems reviewed and is negative except as mentioned in HPI    Objective  Vitals:   09/22/23 0820 09/22/23 0850  BP: (!) 146/84 136/82  Pulse: 97   Resp: 16   Temp: 98.6 F (37 C)   TempSrc: Oral   SpO2: 99%   Weight: 224 lb 1.6 oz (101.7 kg)   Height: 5\' 1"  (1.549 m)     Body mass index is 42.34 kg/m.  Physical Exam  Constitutional: Patient appears well-developed and well-nourished. Obese  No distress.  HEENT: head atraumatic, normocephalic, pupils equal and reactive to light, neck supple Cardiovascular: Normal rate, regular rhythm and normal heart sounds.  No murmur heard. No BLE edema. Pulmonary/Chest: Effort normal and breath sounds normal. No respiratory distress. Abdominal: Soft.  There is no tenderness. Psychiatric: Patient has a normal mood and affect. behavior is normal. Judgment and thought content normal.   Diabetic Foot Exam:     PHQ2/9:    09/22/2023    8:21 AM 02/21/2023    8:11 AM 09/20/2022    7:39 AM 08/23/2022    8:34 AM 02/20/2022    7:53 AM  Depression screen PHQ 2/9  Decreased Interest 0 0 0 0 0  Down, Depressed, Hopeless 0 0 0 0 0  PHQ - 2 Score 0 0 0 0 0  Altered sleeping  0 0 0 0  Tired, decreased energy  0 0 0 0  Change in appetite  0 0 0 0  Feeling bad or failure about yourself   0 0 0 0  Trouble concentrating  0 0 0 0  Moving slowly or fidgety/restless  0 0 0 0  Suicidal thoughts  0 0 0 0  PHQ-9 Score  0 0 0 0  Difficult doing work/chores    Not difficult at all     phq 9 is negative  Fall Risk:    02/21/2023    8:11 AM 09/20/2022    7:39 AM 08/23/2022    8:34 AM 02/20/2022    7:53 AM  09/18/2021    7:49 AM  Fall Risk   Falls in the past year? 0 0 0 0 0  Number falls in past yr: 0 0 0 0 0  Injury with Fall? 0 0 0 0 0  Risk for fall due to :  No Fall Risks No Fall Risks No Fall Risks No Fall Risks No Fall Risks  Follow up Falls prevention discussed Falls prevention discussed Falls prevention discussed;Education provided;Falls evaluation completed Falls prevention discussed Falls prevention discussed     Assessment & Plan   1. Morbid obesity with body mass index (BMI) of 40.0 to 44.9 in adult [facility]) (Primary)  Discussed with the patient the risk posed by an increased BMI. Discussed importance of portion control, calorie counting and at least 150 minutes of physical activity weekly. Avoid sweet beverages and drink more water. Eat at least 6 servings of fruit and vegetables daily    2. Perennial allergic rhinitis  - montelukast (SINGULAIR) 10 MG tablet; Take 1 tablet (10 mg total) by mouth daily.  Dispense: 90 tablet; Refill: 1  3. Essential (primary) hypertension  BP elevated when she arrived  4. GERD without esophagitis  She will contact GI  5. Vitamin D deficiency  Continue supplementation   6. Metabolic syndrome  On life style modification

## 2023-09-25 ENCOUNTER — Telehealth: Payer: Self-pay

## 2023-09-25 ENCOUNTER — Encounter: Payer: Self-pay | Admitting: Family Medicine

## 2023-09-25 NOTE — Telephone Encounter (Signed)
 Patient left a voicemail and states she is calling to schedule a new patient appointment. Return patient call and informed her that there was no new patient appointments available at this time and we would have to give her call back when the appointments open up. Patient verbalized understanding

## 2023-09-26 ENCOUNTER — Other Ambulatory Visit: Payer: Self-pay

## 2023-09-26 DIAGNOSIS — K219 Gastro-esophageal reflux disease without esophagitis: Secondary | ICD-10-CM

## 2023-09-30 ENCOUNTER — Ambulatory Visit (INDEPENDENT_AMBULATORY_CARE_PROVIDER_SITE_OTHER): Payer: BC Managed Care – PPO | Admitting: Family Medicine

## 2023-09-30 ENCOUNTER — Encounter: Payer: Self-pay | Admitting: Family Medicine

## 2023-09-30 VITALS — BP 134/82 | HR 90 | Resp 16 | Ht 61.5 in | Wt 225.0 lb

## 2023-09-30 DIAGNOSIS — Z1231 Encounter for screening mammogram for malignant neoplasm of breast: Secondary | ICD-10-CM

## 2023-09-30 DIAGNOSIS — Z0000 Encounter for general adult medical examination without abnormal findings: Secondary | ICD-10-CM

## 2023-09-30 NOTE — Progress Notes (Signed)
 Name: Kaitlin Schroeder   MRN: [id number]    DOB: 12/22/1980   Date:09/30/2023       Progress Note  Subjective  Chief Complaint  Chief Complaint  Patient presents with   Annual Exam    HPI  Patient presents for annual CPE.  Diet: she is back on Weight Watchers, she currently can eat 23 points.  Exercise: continue current regiment   Last Eye Exam: completed Last Dental Exam: completed  Flowsheet Row Office Visit from 09/22/2023 in [facility]  AUDIT-C Score 1       Depression: Phq 9 is  negative    09/22/2023    8:21 AM 02/21/2023    8:11 AM 09/20/2022    7:39 AM 08/23/2022    8:34 AM 02/20/2022    7:53 AM  Depression screen PHQ 2/9  Decreased Interest 0 0 0 0 0  Down, Depressed, Hopeless 0 0 0 0 0  PHQ - 2 Score 0 0 0 0 0  Altered sleeping  0 0 0 0  Tired, decreased energy  0 0 0 0  Change in appetite  0 0 0 0  Feeling bad or failure about yourself   0 0 0 0  Trouble concentrating  0 0 0 0  Moving slowly or fidgety/restless  0 0 0 0  Suicidal thoughts  0 0 0 0  PHQ-9 Score  0 0 0 0  Difficult doing work/chores    Not difficult at all    Hypertension: BP Readings from Last 3 Encounters:  09/30/23 134/82  09/22/23 136/82  02/21/23 128/82   Obesity: Wt Readings from Last 3 Encounters:  09/30/23 225 lb (102.1 kg)  09/22/23 224 lb 1.6 oz (101.7 kg)  02/21/23 224 lb (101.6 kg)   BMI Readings from Last 3 Encounters:  09/30/23 41.82 kg/m  09/22/23 42.34 kg/m  02/21/23 42.32 kg/m     Vaccines: reviewed with the patient.   Hep C Screening: completed STD testing and prevention (HIV/chl/gon/syphilis): N/A Intimate partner violence: negative screen  Sexual History :not sexually active  Menstrual History/LMP/Abnormal Bleeding: regular cycle, heavy for one day after that gets light, lasts about 4 days. LMP 09/09/2023 Discussed importance of follow up if any post-menopausal bleeding: not applicable  Incontinence Symptoms: negative for symptoms    Breast cancer:  - Last Mammogram: up to date  - BRCA gene screening: two maternal aunts had breast cancer, it was many years ago, total of 7 siblings   Osteoporosis Prevention : Discussed high calcium and vitamin D supplementation, weight bearing exercises Bone density :not applicable   Cervical cancer screening: up-to-date  Skin cancer: Discussed monitoring for atypical lesions  Colorectal cancer: start at age 36   Lung cancer:  Low Dose CT Chest recommended if Age 64-80 years, 20 pack-year currently smoking OR have quit w/in 15years. Patient does not qualify for screen   ECG: up to date   Advanced Care Planning: A voluntary discussion about advance care planning including the explanation and discussion of advance directives.  Discussed health care proxy and Living will, and the patient was able to identify a health care proxy as Belinda .  Patient does not have a living will and power of attorney of health care   Patient Active Problem List   Diagnosis Date Noted   Morbid obesity with body mass index (BMI) of 40.0 to 44.9 in adult [facility]) 09/05/2021   Acanthosis nigricans 12/31/2014   Essential (primary) hypertension 12/31/2014   Metabolic syndrome  12/31/2014   Obesity 12/31/2014   Perennial allergic rhinitis 12/31/2014   Vitamin D deficiency 12/31/2014    Past Surgical History:  Procedure Laterality Date   MOUTH SURGERY      Family History  Problem Relation Age of Onset   Hypertension Mother    Arthritis Mother    Hypertension Father    Diabetes Paternal Grandmother    Breast cancer Maternal Aunt 73   Breast cancer Other 50       maunt   Stroke Maternal Grandmother    Cancer Maternal Aunt     Social History   Socioeconomic History   Marital status: Single    Spouse name: Not on file   Number of children: 0   Years of education: 14   Highest education level: Associate degree: occupational, Scientist, product/process development, or vocational program  Occupational History   Occupation:  Environmental health practitioner   Tobacco Use   Smoking status: Never   Smokeless tobacco: Never  Vaping Use   Vaping status: Never Used  Substance and Sexual Activity   Alcohol use: Yes    Comment: Socially   Drug use: No   Sexual activity: Not Currently    Birth control/protection: Abstinence  Other Topics Concern   Not on file  Social History Narrative   Lives with mother, father ( [place] ) and two sisters.   Social Drivers of Corporate investment banker Strain: Low Risk  (09/18/2023)   Overall Financial Resource Strain (CARDIA)    Difficulty of Paying Living Expenses: Not very hard  Food Insecurity: No Food Insecurity (09/18/2023)   Hunger Vital Sign    Worried About Running Out of Food in the Last Year: Never true    Ran Out of Food in the Last Year: Never true  Transportation Needs: No Transportation Needs (09/18/2023)   PRAPARE - Administrator, Civil Service (Medical): No    Lack of Transportation (Non-Medical): No  Physical Activity: Sufficiently Active (09/18/2023)   Exercise Vital Sign    Days of Exercise per Week: 5 days    Minutes of Exercise per Session: 30 min  Stress: No Stress Concern Present (09/18/2023)   [employer] of Occupational Health - Occupational Stress Questionnaire    Feeling of Stress : Not at all  Social Connections: Moderately Isolated (09/18/2023)   Social Connection and Isolation Panel [NHANES]    Frequency of Communication with Friends and Family: Once a week    Frequency of Social Gatherings with Friends and Family: Once a week    Attends Religious Services: More than 4 times per year    Active Member of [employer] or Organizations: Yes    Attends Banker Meetings: More than 4 times per year    Marital Status: Never married  Intimate Partner Violence: Not At Risk (09/30/2023)   Humiliation, Afraid, Rape, and Kick questionnaire    Fear of Current or Ex-Partner: No    Emotionally Abused: No    Physically Abused: No    Sexually  Abused: No     Current Outpatient Medications:    Cholecalciferol (VITAMIN D) 2000 UNITS tablet, Take 1 tablet by mouth daily., Disp: , Rfl:    famotidine (PEPCID) 40 MG tablet, TAKE 1 TABLET BY MOUTH EVERYDAY AT BEDTIME, Disp: 90 tablet, Rfl: 1   fluticasone (FLONASE) 50 MCG/ACT nasal spray, Place 2 sprays into both nostrils daily., Disp: 48 mL, Rfl: 0   loratadine (CLARITIN) 10 MG tablet, Take 1 tablet (10 mg total) by  mouth daily., Disp: 100 tablet, Rfl: 2   losartan-hydrochlorothiazide (HYZAAR) 50-12.5 MG tablet, TAKE 1 TABLET BY MOUTH EVERY DAY, Disp: 30 tablet, Rfl: 0   montelukast (SINGULAIR) 10 MG tablet, Take 1 tablet (10 mg total) by mouth daily., Disp: 90 tablet, Rfl: 1   Multiple Vitamin tablet, Take 1 tablet by mouth daily., Disp: , Rfl:    Olopatadine HCl 0.2 % SOLN, Apply 1 drop to eye daily., Disp: 2.5 mL, Rfl: 5  No Known Allergies   ROS  Constitutional: Negative for fever or weight change.  Respiratory: Negative for cough and shortness of breath.   Cardiovascular: Negative for chest pain or palpitations.  Gastrointestinal: Negative for abdominal pain, no bowel changes.  Musculoskeletal: Negative for gait problem or joint swelling.  Skin: Negative for rash.  Neurological: Negative for dizziness or headache.  No other specific complaints in a complete review of systems (except as listed in HPI above).   Objective  Vitals:   09/30/23 0745  BP: 134/82  Pulse: 90  Resp: 16  SpO2: 98%  Weight: 225 lb (102.1 kg)  Height: 5' 1.5" (1.562 m)    Body mass index is 41.82 kg/m.  Physical Exam  Constitutional: Patient appears well-developed and well-nourished. No distress.  HENT: Head: Normocephalic and atraumatic. Ears: B TMs ok, no erythema or effusion; Nose: Nose normal. Mouth/Throat: Oropharynx is clear and moist. No oropharyngeal exudate.  Eyes: Conjunctivae and EOM are normal. Pupils are equal, round, and reactive to light. No scleral icterus.  Neck: Normal  range of motion. Neck supple. No JVD present. No thyromegaly present.  Cardiovascular: Normal rate, regular rhythm and normal heart sounds.  No murmur heard. No BLE edema. Pulmonary/Chest: Effort normal and breath sounds normal. No respiratory distress. Abdominal: Soft. Bowel sounds are normal, no distension. There is no tenderness. no masses Breast: no lumps or masses, no nipple discharge or rashes FEMALE GENITALIA:  Not done  Bimanual exam normal without masses RECTAL: not done  Musculoskeletal: Normal range of motion, no joint effusions. No gross deformities Neurological: he is alert and oriented to person, place, and time. No cranial nerve deficit. Coordination, balance, strength, speech and gait are normal.  Skin: Skin is warm and dry. No rash noted. No erythema.  Psychiatric: Patient has a normal mood and affect. behavior is normal. Judgment and thought content normal.     Assessment & Plan  1. Well adult exam (Primary)  Continue life style modification  2. Encounter for screening mammogram for malignant neoplasm of breast   Up to date , repeat in 03/2024  -USPSTF grade A and B recommendations reviewed with patient; age-appropriate recommendations, preventive care, screening tests, etc discussed and encouraged; healthy living encouraged; see AVS for patient education given to patient -Discussed importance of 150 minutes of physical activity weekly, eat two servings of fish weekly, eat one serving of tree nuts ( cashews, pistachios, pecans, almonds.Marland Kitchen) every other day, eat 6 servings of fruit/vegetables daily and drink plenty of water and avoid sweet beverages.   -Reviewed Health Maintenance: Yes.

## 2023-09-30 NOTE — Patient Instructions (Signed)
 Preventive Care 16-44 Years Old, Female  Preventive care refers to lifestyle choices and visits with your health care provider that can promote health and wellness. Preventive care visits are also called wellness exams.  What can I expect for my preventive care visit?  Counseling  Your health care provider may ask you questions about your:  Medical history, including:  Past medical problems.  Family medical history.  Pregnancy history.  Current health, including:  Menstrual cycle.  Method of birth control.  Emotional well-being.  Home life and relationship well-being.  Sexual activity and sexual health.  Lifestyle, including:  Alcohol, nicotine or tobacco, and drug use.  Access to firearms.  Diet, exercise, and sleep habits.  Work and work Astronomer.  Sunscreen use.  Safety issues such as seatbelt and bike helmet use.  Physical exam  Your health care provider will check your:  Height and weight. These may be used to calculate your BMI (body mass index). BMI is a measurement that tells if you are at a healthy weight.  Waist circumference. This measures the distance around your waistline. This measurement also tells if you are at a healthy weight and may help predict your risk of certain diseases, such as type 2 diabetes and high blood pressure.  Heart rate and blood pressure.  Body temperature.  Skin for abnormal spots.  What immunizations do I need?    Vaccines are usually given at various ages, according to a schedule. Your health care provider will recommend vaccines for you based on your age, medical history, and lifestyle or other factors, such as travel or where you work.  What tests do I need?  Screening  Your health care provider may recommend screening tests for certain conditions. This may include:  Lipid and cholesterol levels.  Diabetes screening. This is done by checking your blood sugar (glucose) after you have not eaten for a while (fasting).  Pelvic exam and Pap test.  Hepatitis B test.  Hepatitis C  test.  HIV (human immunodeficiency virus) test.  STI (sexually transmitted infection) testing, if you are at risk.  Lung cancer screening.  Colorectal cancer screening.  Mammogram. Talk with your health care provider about when you should start having regular mammograms. This may depend on whether you have a family history of breast cancer.  BRCA-related cancer screening. This may be done if you have a family history of breast, ovarian, tubal, or peritoneal cancers.  Bone density scan. This is done to screen for osteoporosis.  Talk with your health care provider about your test results, treatment options, and if necessary, the need for more tests.  Follow these instructions at home:  Eating and drinking    Eat a diet that includes fresh fruits and vegetables, whole grains, lean protein, and low-fat dairy products.  Take vitamin and mineral supplements as recommended by your health care provider.  Do not drink alcohol if:  Your health care provider tells you not to drink.  You are pregnant, may be pregnant, or are planning to become pregnant.  If you drink alcohol:  Limit how much you have to 0-1 drink a day.  Know how much alcohol is in your drink. In the [place] one drink equals one 12 oz bottle of beer (355 mL), one 5 oz glass of wine (148 mL), or one 1 oz glass of hard liquor (44 mL).  Lifestyle  Brush your teeth every morning and night with fluoride toothpaste. Floss one time each day.  Exercise for at least  30 minutes 5 or more days each week.  Do not use any products that contain nicotine or tobacco. These products include cigarettes, chewing tobacco, and vaping devices, such as e-cigarettes. If you need help quitting, ask your health care provider.  Do not use drugs.  If you are sexually active, practice safe sex. Use a condom or other form of protection to prevent STIs.  If you do not wish to become pregnant, use a form of birth control. If you plan to become pregnant, see your health care provider for a  prepregnancy visit.  Take aspirin only as told by your health care provider. Make sure that you understand how much to take and what form to take. Work with your health care provider to find out whether it is safe and beneficial for you to take aspirin daily.  Find healthy ways to manage stress, such as:  Meditation, yoga, or listening to music.  Journaling.  Talking to a trusted person.  Spending time with friends and family.  Minimize exposure to UV radiation to reduce your risk of skin cancer.  Safety  Always wear your seat belt while driving or riding in a vehicle.  Do not drive:  If you have been drinking alcohol. Do not ride with someone who has been drinking.  When you are tired or distracted.  While texting.  If you have been using any mind-altering substances or drugs.  Wear a helmet and other protective equipment during sports activities.  If you have firearms in your house, make sure you follow all gun safety procedures.  Seek help if you have been physically or sexually abused.  What's next?  Visit your health care provider once a year for an annual wellness visit.  Ask your health care provider how often you should have your eyes and teeth checked.  Stay up to date on all vaccines.  This information is not intended to replace advice given to you by your health care provider. Make sure you discuss any questions you have with your health care provider.  Document Revised: 12/27/2020 Document Reviewed: 12/27/2020  Elsevier Patient Education  2024 [employer].

## 2023-10-01 ENCOUNTER — Encounter: Payer: Self-pay | Admitting: Family Medicine

## 2023-10-05 ENCOUNTER — Other Ambulatory Visit: Payer: Self-pay | Admitting: Family Medicine

## 2023-10-05 DIAGNOSIS — I10 Essential (primary) hypertension: Secondary | ICD-10-CM

## 2023-12-23 ENCOUNTER — Other Ambulatory Visit: Payer: Self-pay | Admitting: Family Medicine

## 2024-03-12 ENCOUNTER — Ambulatory Visit

## 2024-03-12 DIAGNOSIS — K219 Gastro-esophageal reflux disease without esophagitis: Secondary | ICD-10-CM | POA: Diagnosis present

## 2024-03-12 DIAGNOSIS — R110 Nausea: Secondary | ICD-10-CM | POA: Diagnosis not present

## 2024-03-14 ENCOUNTER — Other Ambulatory Visit: Payer: Self-pay | Admitting: Family Medicine

## 2024-03-14 DIAGNOSIS — K219 Gastro-esophageal reflux disease without esophagitis: Secondary | ICD-10-CM

## 2024-03-22 ENCOUNTER — Other Ambulatory Visit: Payer: Self-pay | Admitting: Family Medicine

## 2024-03-24 ENCOUNTER — Encounter: Payer: Self-pay | Admitting: Family Medicine

## 2024-03-24 ENCOUNTER — Ambulatory Visit: Admitting: Family Medicine

## 2024-03-24 VITALS — BP 114/76 | HR 87 | Resp 16 | Ht 61.5 in | Wt 223.9 lb

## 2024-03-24 DIAGNOSIS — I10 Essential (primary) hypertension: Secondary | ICD-10-CM | POA: Diagnosis not present

## 2024-03-24 DIAGNOSIS — R110 Nausea: Secondary | ICD-10-CM | POA: Diagnosis not present

## 2024-03-24 DIAGNOSIS — Z1159 Encounter for screening for other viral diseases: Secondary | ICD-10-CM

## 2024-03-24 DIAGNOSIS — K219 Gastro-esophageal reflux disease without esophagitis: Secondary | ICD-10-CM

## 2024-03-24 DIAGNOSIS — E8881 Metabolic syndrome: Secondary | ICD-10-CM

## 2024-03-24 DIAGNOSIS — J3089 Other allergic rhinitis: Secondary | ICD-10-CM

## 2024-03-24 DIAGNOSIS — Z13220 Encounter for screening for lipoid disorders: Secondary | ICD-10-CM

## 2024-03-24 DIAGNOSIS — H1013 Acute atopic conjunctivitis, bilateral: Secondary | ICD-10-CM

## 2024-03-24 DIAGNOSIS — E559 Vitamin D deficiency, unspecified: Secondary | ICD-10-CM

## 2024-03-24 DIAGNOSIS — Z6841 Body Mass Index (BMI) 40.0 and over, adult: Secondary | ICD-10-CM

## 2024-03-24 MED ORDER — OLOPATADINE HCL 0.2 % OP SOLN: 1.0000 [drp] | Freq: Every day | OPHTHALMIC | 5 refills | Status: AC

## 2024-03-24 MED ORDER — LOSARTAN POTASSIUM-HCTZ 50-12.5 MG PO TABS: 1.0000 | ORAL_TABLET | Freq: Every day | ORAL | 1 refills | Status: AC

## 2024-03-24 MED ORDER — MONTELUKAST SODIUM 10 MG PO TABS: 10.0000 mg | ORAL_TABLET | Freq: Every day | ORAL | 1 refills | Status: AC

## 2024-03-24 MED ORDER — OMEPRAZOLE 20 MG PO CPDR: 20.0000 mg | DELAYED_RELEASE_CAPSULE | Freq: Two times a day (BID) | ORAL | 1 refills | Status: AC

## 2024-03-24 NOTE — Progress Notes (Signed)
 Name: Kaitlin Schroeder   MRN: [id number]    DOB: Mar 01, 1981   Date:03/24/2024       Progress Note  Subjective  Chief Complaint  Chief Complaint  Patient presents with   Medical Management of Chronic Issues   Discussed the use of AI scribe software for clinical note transcription with the patient, who gave verbal consent to proceed.  History of Present Illness Kaitlin Schroeder is a 43 year old female with GERD and hypertension who presents for follow-up of her gastrointestinal symptoms and blood pressure management.  She visited a GI specialist at the end of July due to uncontrolled reflux and was prescribed omeprazole  20 mg twice daily for 6 to 8 weeks. An upper endoscopy was performed earlier than planned, within four weeks; the patient reports that the results were normal in the esophagus, stomach, and duodenum. Her symptoms are better controlled with omeprazole  compared to famotidine , with less frequent nausea. However, she still experiences nausea after consuming certain foods, particularly egg yolks, which cause her to feel nauseous for a day and a half. She also mentions a sensation of fullness but denies regurgitation or abdominal pain. Her nausea typically occurs between 12 PM and 5 PM. She continues to take omeprazole  20 mg twice daily.  Regarding her hypertension, she is on losartan  and reports that her blood pressure readings have been good at home, with a recent reading of 124/85 mmHg. She recalls a high reading during her endoscopy, attributed to procedural anxiety. No headaches, chest pain, or palpitations.  For her vitamin D  deficiency, she is taking supplements. Her last A1c was 5.7, indicating prediabetes, and she has a family history of diabetes. She maintains a stable weight of 223.9 pounds, similar to last year, and engages in regular physical activity, walking 20-25 minutes, 4-5 days a week. She avoids fried foods and maintains a balanced diet.  She has year-round allergies,  experiencing symptoms such as red, watery eyes. She uses loratadine  and montelukast , and has a prescription for Flonase , which she stopped temporarily due to headaches. She uses saline mist for congestion relief.    Patient Active Problem List   Diagnosis Date Noted   Morbid obesity with body mass index (BMI) of 40.0 to 44.9 in adult [facility]) 09/05/2021   Acanthosis nigricans 12/31/2014   Essential (primary) hypertension 12/31/2014   Metabolic syndrome 12/31/2014   Obesity 12/31/2014   Perennial allergic rhinitis 12/31/2014   Vitamin D  deficiency 12/31/2014    Past Surgical History:  Procedure Laterality Date   MOUTH SURGERY      Family History  Problem Relation Age of Onset   Hypertension Mother    Arthritis Mother    Hypertension Father    Diabetes Paternal Grandmother    Breast cancer Maternal Aunt 76   Breast cancer Other 50       maunt   Stroke Maternal Grandmother    Cancer Maternal Aunt     Social History   Tobacco Use   Smoking status: Never   Smokeless tobacco: Never  Substance Use Topics   Alcohol use: Yes    Comment: Socially     Current Outpatient Medications:    Cholecalciferol (VITAMIN D ) 2000 UNITS tablet, Take 1 tablet by mouth daily., Disp: , Rfl:    fluticasone  (FLONASE ) 50 MCG/ACT nasal spray, SPRAY 2 SPRAYS INTO EACH NOSTRIL EVERY DAY, Disp: 48 mL, Rfl: 0   loratadine  (CLARITIN ) 10 MG tablet, Take 1 tablet (10 mg total) by mouth daily., Disp: 100 tablet, Rfl: 2  losartan -hydrochlorothiazide  (HYZAAR) 50-12.5 MG tablet, TAKE 1 TABLET BY MOUTH EVERY DAY, Disp: 90 tablet, Rfl: 1   montelukast  (SINGULAIR ) 10 MG tablet, Take 1 tablet (10 mg total) by mouth daily., Disp: 90 tablet, Rfl: 1   Multiple Vitamin tablet, Take 1 tablet by mouth daily., Disp: , Rfl:    Olopatadine  HCl 0.2 % SOLN, Apply 1 drop to eye daily., Disp: 2.5 mL, Rfl: 5   omeprazole  (PRILOSEC) 20 MG capsule, Take 20 mg by mouth 2 (two) times daily., Disp: , Rfl:    famotidine  (PEPCID ) 40  MG tablet, TAKE 1 TABLET BY MOUTH EVERYDAY AT BEDTIME (Patient not taking: Reported on 03/24/2024), Disp: 90 tablet, Rfl: 1  No Known Allergies  I personally reviewed active problem list, medication list, allergies, family history with the patient/caregiver today.   ROS  Ten systems reviewed and is negative except as mentioned in HPI    Objective Physical Exam  CONSTITUTIONAL: Patient appears well-developed and well-nourished. No distress. HEENT: Head atraumatic, normocephalic, neck supple. CARDIOVASCULAR: Normal rate, regular rhythm and normal heart sounds. No murmur heard. No BLE edema. PULMONARY: Effort normal and breath sounds normal. Lungs clear to auscultation bilaterally. No respiratory distress. MUSCULOSKELETAL: Normal gait. Without gross motor or sensory deficit. PSYCHIATRIC: Patient has a normal mood and affect. Behavior is normal. Judgment and thought content normal.  Vitals:   03/24/24 0828  BP: 114/76  Pulse: 87  Resp: 16  SpO2: 99%  Weight: 223 lb 14.4 oz (101.6 kg)  Height: 5' 1.5 (1.562 m)    Body mass index is 41.62 kg/m.   PHQ2/9:    03/24/2024    8:24 AM 09/22/2023    8:21 AM 02/21/2023    8:11 AM 09/20/2022    7:39 AM 08/23/2022    8:34 AM  Depression screen PHQ 2/9  Decreased Interest 0 0 0 0 0  Down, Depressed, Hopeless 0 0 0 0 0  PHQ - 2 Score 0 0 0 0 0  Altered sleeping   0 0 0  Tired, decreased energy   0 0 0  Change in appetite   0 0 0  Feeling bad or failure about yourself    0 0 0  Trouble concentrating   0 0 0  Moving slowly or fidgety/restless   0 0 0  Suicidal thoughts   0 0 0  PHQ-9 Score   0 0 0  Difficult doing work/chores     Not difficult at all    phq 9 is negative  Fall Risk:    03/24/2024    8:24 AM 02/21/2023    8:11 AM 09/20/2022    7:39 AM 08/23/2022    8:34 AM 02/20/2022    7:53 AM  Fall Risk   Falls in the past year? 0 0 0 0 0  Number falls in past yr: 0 0 0 0 0  Injury with Fall? 0 0 0 0 0  Risk for fall due to : No  Fall Risks No Fall Risks No Fall Risks No Fall Risks No Fall Risks  Follow up Falls evaluation completed Falls prevention discussed Falls prevention discussed Falls prevention discussed;Education provided;Falls evaluation completed Falls prevention discussed      Data saved with a previous flowsheet row definition      Assessment & Plan Gastroesophageal reflux disease with persistent nausea GERD with persistent nausea, better controlled with omeprazole . Endoscopy normal. Nausea triggered by certain foods. Discussed long-term PPI risks. Considered metoclopramide for motility issues. - Continue omeprazole  20  mg twice daily. - Reduce omeprazole  to once daily as symptoms improve, second dose at 11 AM. - Consider metoclopramide before largest meal if symptoms persist. - Follow up with GI if symptoms continue.  Allergic rhinitis and allergic conjunctivitis Year-round allergies with red, watery eyes. Using loratadine  and montelukast . Flonase  discontinued due to headaches. Allergies may contribute to symptoms. - Prescribe eye drops for allergic conjunctivitis. - Recommend saline nasal spray for congestion.  Essential hypertension Blood pressure well-controlled on losartan . Recent reading 124/85 mmHg.  Morbid obesity BMI 41.83, weight stable. Continues regular physical activity and balanced diet. Previously used GLP-1 agonists.  Metabolic syndrome Prediabetes with A1c at 5.7%. Family history of diabetes. Kidney function good. - Order A1c test today.  Vitamin D  deficiency Currently taking vitamin D  supplements. - Check vitamin D  levels today.

## 2024-03-25 LAB — CBC WITH DIFFERENTIAL/PLATELET
Absolute Lymphocytes: 2288 {cells}/uL (ref 850–3900)
Absolute Monocytes: 464 {cells}/uL (ref 200–950)
Basophils Absolute: 61 {cells}/uL (ref 0–200)
Basophils Relative: 1 %
Eosinophils Absolute: 214 {cells}/uL (ref 15–500)
Eosinophils Relative: 3.5 %
HCT: 38.5 % (ref 35.0–45.0)
Hemoglobin: 12.7 g/dL (ref 11.7–15.5)
MCH: 30.8 pg (ref 27.0–33.0)
MCHC: 33 g/dL (ref 32.0–36.0)
MCV: 93.2 fL (ref 80.0–100.0)
MPV: 10.3 fL (ref 7.5–12.5)
Monocytes Relative: 7.6 %
Neutro Abs: 3074 {cells}/uL (ref 1500–7800)
Neutrophils Relative %: 50.4 %
Platelets: 389 Thousand/uL (ref 140–400)
RBC: 4.13 Million/uL (ref 3.80–5.10)
RDW: 13 % (ref 11.0–15.0)
Total Lymphocyte: 37.5 %
WBC: 6.1 Thousand/uL (ref 3.8–10.8)

## 2024-03-25 LAB — HEMOGLOBIN A1C
Hgb A1c MFr Bld: 5.5 % (ref <5.7)
Mean Plasma Glucose: 111 mg/dL
eAG (mmol/L): 6.2 mmol/L

## 2024-03-25 LAB — LIPID PANEL
Cholesterol: 170 mg/dL (ref <200)
HDL: 38 mg/dL — ABNORMAL LOW (ref >=50)
LDL Cholesterol (Calc): 107 mg/dL — ABNORMAL HIGH
Non-HDL Cholesterol (Calc): 132 mg/dL — ABNORMAL HIGH (ref <130)
Total CHOL/HDL Ratio: 4.5 (calc) (ref <5.0)
Triglycerides: 137 mg/dL (ref <150)

## 2024-03-25 LAB — COMPREHENSIVE METABOLIC PANEL WITH GFR
AG Ratio: 1.6 (calc) (ref 1.0–2.5)
ALT: 15 U/L (ref 6–29)
AST: 15 U/L (ref 10–30)
Albumin: 4.2 g/dL (ref 3.6–5.1)
Alkaline phosphatase (APISO): 48 U/L (ref 31–125)
BUN: 11 mg/dL (ref 7–25)
CO2: 28 mmol/L (ref 20–32)
Calcium: 9.7 mg/dL (ref 8.6–10.2)
Chloride: 104 mmol/L (ref 98–110)
Creat: 0.97 mg/dL (ref 0.50–0.99)
Globulin: 2.7 g/dL (ref 1.9–3.7)
Glucose, Bld: 91 mg/dL (ref 65–99)
Potassium: 4.2 mmol/L (ref 3.5–5.3)
Sodium: 140 mmol/L (ref 135–146)
Total Bilirubin: 0.3 mg/dL (ref 0.2–1.2)
Total Protein: 6.9 g/dL (ref 6.1–8.1)
eGFR: 74 mL/min/1.73m2 (ref >=60)

## 2024-03-25 LAB — VITAMIN D 25 HYDROXY (VIT D DEFICIENCY, FRACTURES): Vit D, 25-Hydroxy: 50 ng/mL (ref 30–100)

## 2024-03-25 LAB — HEPATITIS B SURFACE ANTIBODY,QUALITATIVE: Hep B S Ab: NONREACTIVE

## 2024-03-30 ENCOUNTER — Ambulatory Visit: Payer: Self-pay | Admitting: Family Medicine

## 2024-04-09 ENCOUNTER — Ambulatory Visit
Admission: RE | Admit: 2024-04-09 | Discharge: 2024-04-09 | Disposition: A | Discharge status: Home / Self Care | Source: Ambulatory Visit | Attending: Family Medicine | Admitting: Family Medicine

## 2024-04-09 DIAGNOSIS — Z1231 Encounter for screening mammogram for malignant neoplasm of breast: Secondary | ICD-10-CM | POA: Diagnosis present

## 2024-05-08 ENCOUNTER — Encounter: Payer: Self-pay | Admitting: Family Medicine

## 2024-09-21 ENCOUNTER — Ambulatory Visit: Admitting: Family Medicine

## 2024-09-30 ENCOUNTER — Encounter: Admitting: Family Medicine
